# Patient Record
Sex: Male | Born: 1980 | Race: White | Hispanic: No | Marital: Married | Smoking: Former smoker
Health system: Southern US, Community
[De-identification: ages and names within clinical notes are randomized; demographics above are authoritative.]

---

## 2013-09-18 ENCOUNTER — Ambulatory Visit: Payer: Self-pay | Admitting: Family Medicine

## 2013-10-09 ENCOUNTER — Encounter: Payer: Self-pay | Admitting: Internal Medicine

## 2013-10-09 ENCOUNTER — Ambulatory Visit (INDEPENDENT_AMBULATORY_CARE_PROVIDER_SITE_OTHER): Payer: 59 | Admitting: Internal Medicine

## 2013-10-09 ENCOUNTER — Other Ambulatory Visit (INDEPENDENT_AMBULATORY_CARE_PROVIDER_SITE_OTHER): Payer: 59

## 2013-10-09 VITALS — BP 110/86 | HR 72 | Temp 98.6°F | Resp 16 | Ht 71.0 in | Wt 263.0 lb

## 2013-10-09 DIAGNOSIS — D485 Neoplasm of uncertain behavior of skin: Secondary | ICD-10-CM | POA: Insufficient documentation

## 2013-10-09 DIAGNOSIS — Z0000 Encounter for general adult medical examination without abnormal findings: Secondary | ICD-10-CM

## 2013-10-09 DIAGNOSIS — M4200 Juvenile osteochondrosis of spine, site unspecified: Secondary | ICD-10-CM

## 2013-10-09 LAB — BASIC METABOLIC PANEL
BUN: 11 mg/dL (ref 6–23)
CALCIUM: 9.5 mg/dL (ref 8.4–10.5)
CO2: 25 mEq/L (ref 19–32)
CREATININE: 0.9 mg/dL (ref 0.4–1.5)
Chloride: 106 mEq/L (ref 96–112)
GFR: 100.91 mL/min (ref >60.00)
Glucose, Bld: 83 mg/dL (ref 70–99)
Potassium: 4.1 mEq/L (ref 3.5–5.1)
Sodium: 141 mEq/L (ref 135–145)

## 2013-10-09 LAB — URINALYSIS
Bilirubin Urine: NEGATIVE
Hgb urine dipstick: NEGATIVE
KETONES UR: NEGATIVE
Leukocytes, UA: NEGATIVE
Nitrite: NEGATIVE
PH: 8 (ref 5.0–8.0)
SPECIFIC GRAVITY, URINE: 1.01 (ref 1.000–1.030)
Total Protein, Urine: NEGATIVE
URINE GLUCOSE: NEGATIVE
Urobilinogen, UA: 0.2 (ref 0.0–1.0)

## 2013-10-09 LAB — CBC WITH DIFFERENTIAL/PLATELET
BASOS ABS: 0.1 10*3/uL (ref 0.0–0.1)
Basophils Relative: 1.3 % (ref 0.0–3.0)
EOS ABS: 0.2 10*3/uL (ref 0.0–0.7)
Eosinophils Relative: 2.1 % (ref 0.0–5.0)
HCT: 47.5 % (ref 39.0–52.0)
HEMOGLOBIN: 16.1 g/dL (ref 13.0–17.0)
LYMPHS PCT: 25.7 % (ref 12.0–46.0)
Lymphs Abs: 2.6 10*3/uL (ref 0.7–4.0)
MCHC: 33.8 g/dL (ref 30.0–36.0)
MCV: 85.5 fl (ref 78.0–100.0)
MONOS PCT: 7.1 % (ref 3.0–12.0)
Monocytes Absolute: 0.7 10*3/uL (ref 0.1–1.0)
NEUTROS ABS: 6.4 10*3/uL (ref 1.4–7.7)
NEUTROS PCT: 63.8 % (ref 43.0–77.0)
Platelets: 275 10*3/uL (ref 150.0–400.0)
RBC: 5.56 Mil/uL (ref 4.22–5.81)
RDW: 12.8 % (ref 11.5–15.5)
WBC: 10 10*3/uL (ref 4.0–10.5)

## 2013-10-09 LAB — LIPID PANEL
CHOLESTEROL: 220 mg/dL — AB (ref 0–200)
HDL: 41.7 mg/dL (ref >39.00)
LDL Cholesterol: 147 mg/dL — ABNORMAL HIGH (ref 0–99)
NonHDL: 178.3
Total CHOL/HDL Ratio: 5
Triglycerides: 157 mg/dL — ABNORMAL HIGH (ref 0.0–149.0)
VLDL: 31.4 mg/dL (ref 0.0–40.0)

## 2013-10-09 LAB — HEPATIC FUNCTION PANEL
ALBUMIN: 4.6 g/dL (ref 3.5–5.2)
ALK PHOS: 55 U/L (ref 39–117)
ALT: 41 U/L (ref 0–53)
AST: 27 U/L (ref 0–37)
BILIRUBIN DIRECT: 0.1 mg/dL (ref 0.0–0.3)
Total Bilirubin: 0.8 mg/dL (ref 0.2–1.2)
Total Protein: 8 g/dL (ref 6.0–8.3)

## 2013-10-09 LAB — TSH: TSH: 2.87 u[IU]/mL (ref 0.35–4.50)

## 2013-10-09 MED ORDER — VITAMIN D 1000 UNITS PO TABS: 1000.0000 [IU] | ORAL_TABLET | Freq: Every day | ORAL | Status: AC

## 2013-10-09 NOTE — Progress Notes (Signed)
Pre visit review using our clinic review tool, if applicable. No additional management support is needed unless otherwise documented below in the visit note. 

## 2013-10-09 NOTE — Assessment & Plan Note (Signed)
We discussed age appropriate health related issues, including available/recomended screening tests and vaccinations. We discussed a need for adhering to healthy diet and exercise. Labs/EKG were reviewed/ordered. All questions were answered.   

## 2013-10-09 NOTE — Assessment & Plan Note (Signed)
Moles Had a nl bx 2-3 years ago 2012 Will watch

## 2013-10-09 NOTE — Assessment & Plan Note (Signed)
Kyphosis/scoliosis since high school years Dr Tamala Julian - will consult re: physical activity

## 2013-10-09 NOTE — Progress Notes (Signed)
   Subjective:    HPI  New pt - wellness exam  C/o scoliosis and kyphosis that was discovered in high school   Review of Systems  Constitutional: Negative for appetite change, fatigue and unexpected weight change.  HENT: Negative for congestion, dental problem, hearing loss, nosebleeds, sneezing, sore throat and trouble swallowing.   Eyes: Negative for itching and visual disturbance.  Respiratory: Negative for cough.   Cardiovascular: Negative for chest pain, palpitations and leg swelling.  Gastrointestinal: Negative for nausea, diarrhea, blood in stool and abdominal distention.  Genitourinary: Negative for urgency, frequency and hematuria.  Musculoskeletal: Negative for back pain, gait problem, joint swelling and neck pain.  Skin: Negative for rash.  Neurological: Negative for dizziness, tremors, speech difficulty and weakness.  Psychiatric/Behavioral: Negative for suicidal ideas, sleep disturbance, dysphoric mood and agitation. The patient is not nervous/anxious.        Objective:   Physical Exam  Constitutional: He is oriented to person, place, and time. He appears well-developed. No distress.  NAD  HENT:  Mouth/Throat: Oropharynx is clear and moist.  Eyes: Conjunctivae are normal. Pupils are equal, round, and reactive to light.  Neck: Normal range of motion. No JVD present. No thyromegaly present.  Cardiovascular: Normal rate, regular rhythm, normal heart sounds and intact distal pulses.  Exam reveals no gallop and no friction rub.   No murmur heard. Pulmonary/Chest: Effort normal and breath sounds normal. No respiratory distress. He has no wheezes. He has no rales. He exhibits no tenderness.  Abdominal: Soft. Bowel sounds are normal. He exhibits no distension and no mass. There is no tenderness. There is no rebound and no guarding.  Musculoskeletal: Normal range of motion. He exhibits no edema and no tenderness.  Big chest, prominent kyphosis, mild R thoracic spine  scoliosis  Lymphadenopathy:    He has no cervical adenopathy.  Neurological: He is alert and oriented to person, place, and time. He has normal reflexes. No cranial nerve deficit. He exhibits normal muscle tone. He displays a negative Romberg sign. Coordination and gait normal.  Skin: Skin is warm and dry. No rash noted.  Psychiatric: He has a normal mood and affect. His behavior is normal. Judgment and thought content normal.   Moles on back    Assessment & Plan:

## 2013-10-27 ENCOUNTER — Ambulatory Visit (INDEPENDENT_AMBULATORY_CARE_PROVIDER_SITE_OTHER)
Admission: RE | Admit: 2013-10-27 | Discharge: 2013-10-27 | Disposition: A | Discharge status: Home / Self Care | Payer: 59 | Source: Ambulatory Visit | Attending: Family Medicine | Admitting: Family Medicine

## 2013-10-27 ENCOUNTER — Ambulatory Visit (INDEPENDENT_AMBULATORY_CARE_PROVIDER_SITE_OTHER): Payer: 59 | Admitting: Family Medicine

## 2013-10-27 ENCOUNTER — Other Ambulatory Visit: Payer: Self-pay | Admitting: Family Medicine

## 2013-10-27 ENCOUNTER — Encounter: Payer: Self-pay | Admitting: Family Medicine

## 2013-10-27 VITALS — BP 124/80 | HR 66 | Ht 71.0 in | Wt 261.0 lb

## 2013-10-27 DIAGNOSIS — M4120 Other idiopathic scoliosis, site unspecified: Secondary | ICD-10-CM

## 2013-10-27 DIAGNOSIS — M419 Scoliosis, unspecified: Secondary | ICD-10-CM

## 2013-10-27 NOTE — Patient Instructions (Signed)
You are doing great! We will get new xrays today.  Vitamin D 2000 IU daily.  Exercises you are doing are great.   Continue to focus on back, upper body but need lower body but only with body weight.  .Posture on wall.  Stand on wall with heels, butt shoulders and head touching for a goal of 5 minutes daily.  Avoid any weighted squats. Swimming only 1 time a week.  Yoga could be great.  Focus on core strength.  ListRules.co.uk.php.  Look at website.  Or livestrong.com look up scoliosis.  Try some of those stretches 3 times a week.  Important to watch weight  Also good shoes at all times.  Thicker rigid sole shoes.  If you need help with the weight loss or exercises then come back in 4 weeks.

## 2013-10-27 NOTE — Progress Notes (Signed)
  [name] [address], [place] [address] Phone: [phone] Subjective:    I'm seeing this patient by the request  of:  [name], MD   CC: Scoliosis  DJS:[id number] Ricky [name] is a 33 y.o. male coming in with complaint of scoliosis. Patient does have some mild back pain. Patient states he was diagnosed when he was an adolescent. Patient has been working on back strengthening exercises for most of his life to try to avoid knee pain. Patient states he'll he has some mild discomfort if he stands for long amount of time. Does not take any pain medications. Attempts to the gym 2-3 times a week. Patient is focus on the back mostly with his weightlifting. Not to allow legs or cardiovascular fitness. Denies any radiation down the legs or any numbness. Patient would like some further discussion about what would be safe and what to avoid.     Past medical history, social, surgical and family history all reviewed in electronic medical record.   Review of Systems: No headache, visual changes, nausea, vomiting, diarrhea, constipation, dizziness, abdominal pain, skin rash, fevers, chills, night sweats, weight loss, swollen lymph nodes, body aches, joint swelling, muscle aches, chest pain, shortness of breath, mood changes.   Objective Blood pressure 124/80, pulse 66, height 5\' 11"  (1.803 m), weight 261 lb (118.389 kg), SpO2 99.00%.  General: No apparent distress alert and oriented x3 mood and affect normal, dressed appropriately.  HEENT: Pupils equal, extraocular movements intact  Respiratory: Patient's speak in full sentences and does not appear short of breath  Cardiovascular: No lower extremity edema, non tender, no erythema  Skin: Warm dry intact with no signs of infection or rash on extremities or on axial skeleton.  Abdomen: Soft nontender  Neuro: Cranial nerves II through XII are intact, neurovascularly intact in all extremities with 2+ DTRs and  2+ pulses.  Lymph: No lymphadenopathy of posterior or anterior cervical chain or axillae bilaterally.  Gait normal with good balance and coordination.  MSK:  Non tender with full range of motion and good stability and symmetric strength and tone of shoulders, elbows, wrist, hip, knee and ankles bilaterally.  Back Exam:  Inspection: Dextroscoliosis of the thoracic spine poor course strength Motion: Flexion 35 deg, Extension 30 deg, Side Bending to 35 deg bilaterally,  Rotation to 35 deg bilaterally  SLR laying: Negative  XSLR laying: Negative  Palpable tenderness: Mild in the thoracic lumbar region no spinous process tenderness.. FABER: negative. Sensory change: Gross sensation intact to all lumbar and sacral dermatomes.  Reflexes: 2+ at both patellar tendons, 2+ at achilles tendons, Babinski's downgoing.  Strength at foot  Plantar-flexion: 5/5 Dorsi-flexion: 5/5 Eversion: 5/5 Inversion: 5/5  Leg strength  Quad: 5/5 Hamstring: 5/5 Hip flexor: 5/5 Hip abductors: 5/5  Gait unremarkable.     Impression and Recommendations:     This case required medical decision making of moderate complexity.

## 2013-10-27 NOTE — Assessment & Plan Note (Signed)
Patient does have what appears to be about a 30 curve and only a primary curve in the thoracic spine with dextro scoliosis. Patient is remarkably Asymptomatic though at baseline. Encourage patient though to be significantly more active try to increase the amount of exercises he does per week. We discussed avoiding repetitive extension of the back at this time but is working on work postural exercises as well as working positioning. We also discussed about the importance of core strengthening which will alleviate a lot of pain on the back. We discussed over-the-counter medications it could also be beneficial to slow down the progression of osteoarthritis. Patient will try these interventions as well as weight loss and come back and see me again in 4-6 weeks for further evaluation and treatment. X-rays were ordered today as well for baseline.

## 2014-02-01 ENCOUNTER — Ambulatory Visit (INDEPENDENT_AMBULATORY_CARE_PROVIDER_SITE_OTHER): Payer: Managed Care, Other (non HMO)

## 2014-02-01 DIAGNOSIS — Z23 Encounter for immunization: Secondary | ICD-10-CM

## 2016-03-31 ENCOUNTER — Ambulatory Visit (INDEPENDENT_AMBULATORY_CARE_PROVIDER_SITE_OTHER): Payer: BLUE CROSS/BLUE SHIELD | Admitting: Nurse Practitioner

## 2016-03-31 ENCOUNTER — Encounter: Payer: Self-pay | Admitting: Nurse Practitioner

## 2016-03-31 VITALS — BP 116/84 | HR 66 | Temp 98.0°F | Ht 71.0 in | Wt 271.0 lb

## 2016-03-31 DIAGNOSIS — J069 Acute upper respiratory infection, unspecified: Secondary | ICD-10-CM

## 2016-03-31 MED ORDER — FLUTICASONE PROPIONATE 50 MCG/ACT NA SUSP: 2.0000 | Freq: Every day | NASAL | 0 refills | Status: DC

## 2016-03-31 MED ORDER — HYDROCODONE-HOMATROPINE 5-1.5 MG/5ML PO SYRP: 10.0000 mL | ORAL_SOLUTION | Freq: Every evening | ORAL | 0 refills | Status: DC | PRN

## 2016-03-31 MED ORDER — OXYMETAZOLINE HCL 0.05 % NA SOLN: 1.0000 | Freq: Two times a day (BID) | NASAL | 0 refills | Status: DC

## 2016-03-31 MED ORDER — DM-GUAIFENESIN ER 30-600 MG PO TB12: 1.0000 | ORAL_TABLET | Freq: Two times a day (BID) | ORAL | 0 refills | Status: DC | PRN

## 2016-03-31 MED ORDER — SALINE SPRAY 0.65 % NA SOLN: 1.0000 | NASAL | 0 refills | Status: DC | PRN

## 2016-03-31 NOTE — Progress Notes (Signed)
Subjective:  Patient ID: Ricky [name], male    DOB: Sep 21, 1980  Age: 35 y.o. MRN: [id number]  CC: Cough (coughing yellow mucus,slight sore throat,weak for 5 days. took OTC (nyquil))   Cough  This is a new problem. The current episode started in the past 7 days. The problem has been unchanged. The problem occurs constantly. The cough is productive of sputum. Associated symptoms include nasal congestion, postnasal drip, rhinorrhea and a sore throat. Pertinent negatives include no chest pain, chills, ear congestion, ear pain, fever, headaches, heartburn, hemoptysis, myalgias, rash, shortness of breath, sweats, weight loss or wheezing. The symptoms are aggravated by cold air and lying down. He has tried OTC cough suppressant for the symptoms.    No outpatient prescriptions prior to visit.   No facility-administered medications prior to visit.     ROS See HPI  Objective:  BP 116/84   Pulse 66   Temp 98 F (36.7 C)   Ht 5\' 11"  (1.803 m)   Wt 271 lb (122.9 kg)   SpO2 97%   BMI 37.80 kg/m   BP Readings from Last 3 Encounters:  03/31/16 116/84  10/27/13 124/80  10/09/13 110/86    Wt Readings from Last 3 Encounters:  03/31/16 271 lb (122.9 kg)  10/27/13 261 lb (118.4 kg)  10/09/13 263 lb (119.3 kg)    Physical Exam  Constitutional: He is oriented to person, place, and time. No distress.  HENT:  Right Ear: Tympanic membrane, external ear and ear canal normal.  Left Ear: Tympanic membrane and ear canal normal.  Nose: Mucosal edema and rhinorrhea present. Right sinus exhibits no maxillary sinus tenderness and no frontal sinus tenderness. Left sinus exhibits no maxillary sinus tenderness and no frontal sinus tenderness.  Mouth/Throat: Uvula is midline. Posterior oropharyngeal erythema present. No oropharyngeal exudate.  Eyes: No scleral icterus.  Neck: Normal range of motion. Neck supple.  Cardiovascular: Normal rate and regular rhythm.   Pulmonary/Chest: Effort normal and  breath sounds normal.  Lymphadenopathy:    He has no cervical adenopathy.  Neurological: He is alert and oriented to person, place, and time.  Vitals reviewed.   Lab Results  Component Value Date   WBC 10.0 10/09/2013   HGB 16.1 10/09/2013   HCT 47.5 10/09/2013   PLT 275.0 10/09/2013   GLUCOSE 83 10/09/2013   CHOL 220 (H) 10/09/2013   TRIG 157.0 (H) 10/09/2013   HDL 41.70 10/09/2013   LDLCALC 147 (H) 10/09/2013   ALT 41 10/09/2013   AST 27 10/09/2013   NA 141 10/09/2013   K 4.1 10/09/2013   CL 106 10/09/2013   CREATININE 0.9 10/09/2013   BUN 11 10/09/2013   CO2 25 10/09/2013   TSH 2.87 10/09/2013    Dg Thoracic Spine W/swimmers  Result Date: 10/27/2013 CLINICAL DATA:  Back pain EXAM: THORACIC SPINE - 2 VIEW + SWIMMERS COMPARISON:  None. FINDINGS: Frontal, lateral, and swimmer's views were obtained. There is anterior wedging of the the T10, T11, T12, L1, and L2 vertebral bodies with the greatest degree of wedging at T11 and T12. There is increased kyphosis in the lower thoracic region. No spondylolisthesis. There is mild disc space narrowing at multiple levels. IMPRESSION: Multiple lower thoracic and upper lumbar compression fractures, age uncertain. Increase kyphosis in the lower thoracic region, apparently due to these areas of compression. No spondylolisthesis. 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 Electronically Signed   By: Lowella Grip M.D.   On: 10/27/2013 09:10  Dg Lumbar Spine Complete  Result Date: 10/27/2013 CLINICAL DATA:  Scoliosis, back pain EXAM: LUMBAR SPINE - COMPLETE 4+ VIEW COMPARISON:  None. FINDINGS: Five views of lumbar spine submitted. No acute fracture or subluxation. Mild anterior spurring upper and lower endplate of L1 vertebral body. There is mild disc space flattening with anterior spurring at L1-L2 level. Mild disc space flattening at T12-L1 level. Mild disc space flattening at L5-S1 level. There is  mild compression deformity upper endplate of 624THL vertebral body. Moderate compression deformity T10 and T11 vertebral bodies. These are of indeterminate age. Clinical correlation is necessary. IMPRESSION: Degenerative changes as described above. There is mild compression deformity upper endplate of 624THL vertebral body. Moderate compression deformity T10 and T11 vertebral bodies. These are of indeterminate age. Clinical correlation is necessary. Electronically Signed   By: Lahoma Crocker M.D.   On: 10/27/2013 09:25    Assessment & Plan:   Ricky [name] was seen today for cough.  Diagnoses and all orders for this visit:  Acute URI -     fluticasone (FLONASE) 50 MCG/ACT nasal spray; Place 2 sprays into both nostrils daily. -     oxymetazoline (AFRIN NASAL SPRAY) 0.05 % nasal spray; Place 1 spray into both nostrils 2 (two) times daily. Use only for 3days, then stop -     dextromethorphan-guaiFENesin (MUCINEX DM) 30-600 MG 12hr tablet; Take 1 tablet by mouth 2 (two) times daily as needed for cough. -     sodium chloride (OCEAN) 0.65 % SOLN nasal spray; Place 1 spray into both nostrils as needed for congestion. -     HYDROcodone-homatropine (HYCODAN) 5-1.5 MG/5ML syrup; Take 10 mLs by mouth at bedtime as needed for cough.   I am having Ricky [name] start on fluticasone, oxymetazoline, dextromethorphan-guaiFENesin, sodium chloride, and HYDROcodone-homatropine.  Meds ordered this encounter  Medications  . fluticasone (FLONASE) 50 MCG/ACT nasal spray    Sig: Place 2 sprays into both nostrils daily.    Dispense:  16 g    Refill:  0    Order Specific Question:   Supervising Provider    Answer:   Cassandria Anger [[id number]]  . oxymetazoline (AFRIN NASAL SPRAY) 0.05 % nasal spray    Sig: Place 1 spray into both nostrils 2 (two) times daily. Use only for 3days, then stop    Dispense:  30 mL    Refill:  0    Order Specific Question:   Supervising Provider    Answer:   Cassandria Anger [[id number]]  .  dextromethorphan-guaiFENesin (MUCINEX DM) 30-600 MG 12hr tablet    Sig: Take 1 tablet by mouth 2 (two) times daily as needed for cough.    Dispense:  14 tablet    Refill:  0    Order Specific Question:   Supervising Provider    Answer:   Cassandria Anger [[id number]]  . sodium chloride (OCEAN) 0.65 % SOLN nasal spray    Sig: Place 1 spray into both nostrils as needed for congestion.    Dispense:  15 mL    Refill:  0    Order Specific Question:   Supervising Provider    Answer:   Cassandria Anger [[id number]]  . HYDROcodone-homatropine (HYCODAN) 5-1.5 MG/5ML syrup    Sig: Take 10 mLs by mouth at bedtime as needed for cough.    Dispense:  120 mL    Refill:  0    Order Specific Question:   Supervising Provider    Answer:   Cassandria Anger [  [id number]]    Follow-up: No Follow-up on file.  Wilfred Lacy, NP

## 2016-03-31 NOTE — Progress Notes (Signed)
Pre visit review using our clinic review tool, if applicable. No additional management support is needed unless otherwise documented below in the visit note. 

## 2016-03-31 NOTE — Patient Instructions (Signed)
URI Instructions: Flonase and Afrin use: apply 1spray of afrin in each nare, wait 38mins, then apply 2sprays of flonase in each nare. Use both nasal spray consecutively x 3days, then flonase only for at least 14days.  Encourage adequate oral hydration.  Use over-the-counter  "cold" medicines  such as "Tylenol cold" , "Advil cold",  "Mucinex" or" Mucinex D"  for cough and congestion.  Avoid decongestants if you have high blood pressure. Use" Delsym" or" Robitussin" cough syrup varietis for cough.  You can use plain "Tylenol" or "Advi"l for fever, chills and achyness.   "Common cold" symptoms are usually triggered by a virus.  The antibiotics are usually not necessary. On average, a" viral cold" illness would take 4-7 days to resolve. Please, make an appointment if you are not better or if you're worse.   Call office for oral abx prescription if no improvement by Friday.

## 2016-05-05 ENCOUNTER — Ambulatory Visit (INDEPENDENT_AMBULATORY_CARE_PROVIDER_SITE_OTHER): Payer: BLUE CROSS/BLUE SHIELD | Admitting: General Practice

## 2016-05-05 ENCOUNTER — Ambulatory Visit: Payer: BLUE CROSS/BLUE SHIELD

## 2016-05-05 DIAGNOSIS — Z23 Encounter for immunization: Secondary | ICD-10-CM | POA: Diagnosis not present

## 2016-09-03 ENCOUNTER — Ambulatory Visit (INDEPENDENT_AMBULATORY_CARE_PROVIDER_SITE_OTHER): Payer: BLUE CROSS/BLUE SHIELD | Admitting: Internal Medicine

## 2016-09-03 ENCOUNTER — Other Ambulatory Visit (INDEPENDENT_AMBULATORY_CARE_PROVIDER_SITE_OTHER): Payer: BLUE CROSS/BLUE SHIELD

## 2016-09-03 ENCOUNTER — Encounter: Payer: Self-pay | Admitting: Internal Medicine

## 2016-09-03 DIAGNOSIS — H18609 Keratoconus, unspecified, unspecified eye: Secondary | ICD-10-CM | POA: Insufficient documentation

## 2016-09-03 DIAGNOSIS — Z0000 Encounter for general adult medical examination without abnormal findings: Secondary | ICD-10-CM | POA: Diagnosis not present

## 2016-09-03 DIAGNOSIS — H18603 Keratoconus, unspecified, bilateral: Secondary | ICD-10-CM

## 2016-09-03 LAB — BASIC METABOLIC PANEL
BUN: 10 mg/dL (ref 6–23)
CO2: 25 mEq/L (ref 19–32)
CREATININE: 1.04 mg/dL (ref 0.40–1.50)
Calcium: 9.6 mg/dL (ref 8.4–10.5)
Chloride: 106 mEq/L (ref 96–112)
GFR: 86.09 mL/min (ref >60.00)
Glucose, Bld: 92 mg/dL (ref 70–99)
Potassium: 4.2 mEq/L (ref 3.5–5.1)
Sodium: 140 mEq/L (ref 135–145)

## 2016-09-03 LAB — URINALYSIS
Bilirubin Urine: NEGATIVE
HGB URINE DIPSTICK: NEGATIVE
Ketones, ur: 40 — AB
Leukocytes, UA: NEGATIVE
Nitrite: NEGATIVE
SPECIFIC GRAVITY, URINE: 1.025 (ref 1.000–1.030)
Total Protein, Urine: NEGATIVE
URINE GLUCOSE: NEGATIVE
Urobilinogen, UA: 0.2 (ref 0.0–1.0)
pH: 6 (ref 5.0–8.0)

## 2016-09-03 LAB — CBC WITH DIFFERENTIAL/PLATELET
BASOS ABS: 0.1 10*3/uL (ref 0.0–0.1)
Basophils Relative: 1.1 % (ref 0.0–3.0)
Eosinophils Absolute: 0.1 10*3/uL (ref 0.0–0.7)
Eosinophils Relative: 1.8 % (ref 0.0–5.0)
HCT: 44 % (ref 39.0–52.0)
HEMOGLOBIN: 14.9 g/dL (ref 13.0–17.0)
Lymphocytes Relative: 42 % (ref 12.0–46.0)
Lymphs Abs: 3.4 10*3/uL (ref 0.7–4.0)
MCHC: 33.9 g/dL (ref 30.0–36.0)
MCV: 84.3 fl (ref 78.0–100.0)
Monocytes Absolute: 0.6 10*3/uL (ref 0.1–1.0)
Monocytes Relative: 7.7 % (ref 3.0–12.0)
Neutro Abs: 3.8 10*3/uL (ref 1.4–7.7)
Neutrophils Relative %: 47.4 % (ref 43.0–77.0)
Platelets: 274 10*3/uL (ref 150.0–400.0)
RBC: 5.22 Mil/uL (ref 4.22–5.81)
RDW: 13.2 % (ref 11.5–15.5)
WBC: 8.1 10*3/uL (ref 4.0–10.5)

## 2016-09-03 LAB — LIPID PANEL
CHOL/HDL RATIO: 5
Cholesterol: 174 mg/dL (ref 0–200)
HDL: 37.1 mg/dL — ABNORMAL LOW (ref >39.00)
LDL CALC: 117 mg/dL — AB (ref 0–99)
NonHDL: 136.51
TRIGLYCERIDES: 99 mg/dL (ref 0.0–149.0)
VLDL: 19.8 mg/dL (ref 0.0–40.0)

## 2016-09-03 LAB — HEPATIC FUNCTION PANEL
ALT: 34 U/L (ref 0–53)
AST: 22 U/L (ref 0–37)
Albumin: 4.6 g/dL (ref 3.5–5.2)
Alkaline Phosphatase: 45 U/L (ref 39–117)
BILIRUBIN DIRECT: 0.1 mg/dL (ref 0.0–0.3)
BILIRUBIN TOTAL: 0.6 mg/dL (ref 0.2–1.2)
Total Protein: 7.6 g/dL (ref 6.0–8.3)

## 2016-09-03 LAB — TSH: TSH: 2.07 u[IU]/mL (ref 0.35–4.50)

## 2016-09-03 NOTE — Assessment & Plan Note (Signed)
Per ophth ref

## 2016-09-03 NOTE — Assessment & Plan Note (Addendum)
We discussed age appropriate health related issues, including available/recomended screening tests and vaccinations. We discussed a need for adhering to healthy diet and exercise. Labs were ordered to be later reviewed . All questions were answered. Eye exam

## 2016-09-03 NOTE — Progress Notes (Signed)
Subjective:  Patient ID: Ricky [name], male    DOB: 08-Sep-1980  Age: 36 y.o. MRN: [id number]  CC: No chief complaint on file.   HPI Ricky [name] presents for a well exam. Lost wt on diet  Outpatient Medications Prior to Visit  Medication Sig Dispense Refill  . dextromethorphan-guaiFENesin (MUCINEX DM) 30-600 MG 12hr tablet Take 1 tablet by mouth 2 (two) times daily as needed for cough. 14 tablet 0  . fluticasone (FLONASE) 50 MCG/ACT nasal spray Place 2 sprays into both nostrils daily. 16 g 0  . HYDROcodone-homatropine (HYCODAN) 5-1.5 MG/5ML syrup Take 10 mLs by mouth at bedtime as needed for cough. 120 mL 0  . oxymetazoline (AFRIN NASAL SPRAY) 0.05 % nasal spray Place 1 spray into both nostrils 2 (two) times daily. Use only for 3days, then stop 30 mL 0  . sodium chloride (OCEAN) 0.65 % SOLN nasal spray Place 1 spray into both nostrils as needed for congestion. 15 mL 0   No facility-administered medications prior to visit.     ROS Review of Systems  Constitutional: Negative for appetite change, fatigue and unexpected weight change.  HENT: Negative for congestion, nosebleeds, sneezing, sore throat and trouble swallowing.   Eyes: Negative for itching and visual disturbance.  Respiratory: Negative for cough.   Cardiovascular: Negative for chest pain, palpitations and leg swelling.  Gastrointestinal: Negative for abdominal distention, blood in stool, diarrhea and nausea.  Genitourinary: Negative for frequency and hematuria.  Musculoskeletal: Negative for back pain, gait problem, joint swelling and neck pain.  Skin: Negative for rash.  Neurological: Negative for dizziness, tremors, speech difficulty and weakness.  Psychiatric/Behavioral: Negative for agitation, dysphoric mood and sleep disturbance. The patient is not nervous/anxious.     Objective:  BP 122/76 (BP Location: Left Arm, Patient Position: Sitting, Cuff Size: Large)   Pulse 70   Temp 98.5 F (36.9 C) (Oral)   Ht 5'  11" (1.803 m)   Wt 257 lb (116.6 kg)   SpO2 98%   BMI 35.84 kg/m   BP Readings from Last 3 Encounters:  09/03/16 122/76  03/31/16 116/84  10/27/13 124/80    Wt Readings from Last 3 Encounters:  09/03/16 257 lb (116.6 kg)  03/31/16 271 lb (122.9 kg)  10/27/13 261 lb (118.4 kg)    Physical Exam  Constitutional: He is oriented to person, place, and time. He appears well-developed. No distress.  NAD  HENT:  Mouth/Throat: Oropharynx is clear and moist.  Eyes: Conjunctivae are normal. Pupils are equal, round, and reactive to light.  Neck: Normal range of motion. No JVD present. No thyromegaly present.  Cardiovascular: Normal rate, regular rhythm, normal heart sounds and intact distal pulses.  Exam reveals no gallop and no friction rub.   No murmur heard. Pulmonary/Chest: Effort normal and breath sounds normal. No respiratory distress. He has no wheezes. He has no rales. He exhibits no tenderness.  Abdominal: Soft. Bowel sounds are normal. He exhibits no distension and no mass. There is no tenderness. There is no rebound and no guarding.  Musculoskeletal: Normal range of motion. He exhibits no edema or tenderness.  Lymphadenopathy:    He has no cervical adenopathy.  Neurological: He is alert and oriented to person, place, and time. He has normal reflexes. No cranial nerve deficit. He exhibits normal muscle tone. He displays a negative Romberg sign. Coordination and gait normal.  Skin: Skin is warm and dry. No rash noted.  Psychiatric: He has a normal mood and affect. His behavior is  normal. Judgment and thought content normal.    Lab Results  Component Value Date   WBC 10.0 10/09/2013   HGB 16.1 10/09/2013   HCT 47.5 10/09/2013   PLT 275.0 10/09/2013   GLUCOSE 83 10/09/2013   CHOL 220 (H) 10/09/2013   TRIG 157.0 (H) 10/09/2013   HDL 41.70 10/09/2013   LDLCALC 147 (H) 10/09/2013   ALT 41 10/09/2013   AST 27 10/09/2013   NA 141 10/09/2013   K 4.1 10/09/2013   CL 106  10/09/2013   CREATININE 0.9 10/09/2013   BUN 11 10/09/2013   CO2 25 10/09/2013   TSH 2.87 10/09/2013    Dg Thoracic Spine W/swimmers  Result Date: 10/27/2013 CLINICAL DATA:  Back pain EXAM: THORACIC SPINE - 2 VIEW + SWIMMERS COMPARISON:  None. FINDINGS: Frontal, lateral, and swimmer's views were obtained. There is anterior wedging of the the T10, T11, T12, L1, and L2 vertebral bodies with the greatest degree of wedging at T11 and T12. There is increased kyphosis in the lower thoracic region. No spondylolisthesis. There is mild disc space narrowing at multiple levels. IMPRESSION: Multiple lower thoracic and upper lumbar compression fractures, age uncertain. Increase kyphosis in the lower thoracic region, apparently due to these areas of compression. No spondylolisthesis. 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 Electronically Signed   By: Lowella Grip M.D.   On: 10/27/2013 09:10   Dg Lumbar Spine Complete  Result Date: 10/27/2013 CLINICAL DATA:  Scoliosis, back pain EXAM: LUMBAR SPINE - COMPLETE 4+ VIEW COMPARISON:  None. FINDINGS: Five views of lumbar spine submitted. No acute fracture or subluxation. Mild anterior spurring upper and lower endplate of L1 vertebral body. There is mild disc space flattening with anterior spurring at L1-L2 level. Mild disc space flattening at T12-L1 level. Mild disc space flattening at L5-S1 level. There is mild compression deformity upper endplate of P53 vertebral body. Moderate compression deformity T10 and T11 vertebral bodies. These are of indeterminate age. Clinical correlation is necessary. IMPRESSION: Degenerative changes as described above. There is mild compression deformity upper endplate of Z48 vertebral body. Moderate compression deformity T10 and T11 vertebral bodies. These are of indeterminate age. Clinical correlation is necessary. Electronically Signed   By: Lahoma Crocker M.D.   On: 10/27/2013 09:25     Assessment & Plan:   There are no diagnoses linked to this encounter. I have discontinued Mr. Handlin fluticasone, oxymetazoline, dextromethorphan-guaiFENesin, sodium chloride, and HYDROcodone-homatropine. I am also having him maintain his Omega-3 Fatty Acids (FISH OIL PO) and cholecalciferol.  Meds ordered this encounter  Medications  . Omega-3 Fatty Acids (FISH OIL PO)    Sig: Take by mouth.  . cholecalciferol (VITAMIN D) 1000 units tablet    Sig: Take 1,000 Units by mouth daily.     Follow-up: No Follow-up on file.  [name], MD

## 2017-12-30 ENCOUNTER — Encounter: Payer: Self-pay | Admitting: Internal Medicine

## 2017-12-30 ENCOUNTER — Ambulatory Visit (INDEPENDENT_AMBULATORY_CARE_PROVIDER_SITE_OTHER): Payer: BLUE CROSS/BLUE SHIELD | Admitting: Internal Medicine

## 2017-12-30 DIAGNOSIS — H9201 Otalgia, right ear: Secondary | ICD-10-CM

## 2017-12-30 DIAGNOSIS — H9209 Otalgia, unspecified ear: Secondary | ICD-10-CM | POA: Insufficient documentation

## 2017-12-30 MED ORDER — NEOMYCIN-POLYMYXIN-HC 3.5-10000-1 OT SOLN: 3.0000 [drp] | Freq: Three times a day (TID) | OTIC | 3 refills | Status: AC

## 2017-12-30 MED ORDER — CEFDINIR 300 MG PO CAPS: 300.0000 mg | ORAL_CAPSULE | Freq: Two times a day (BID) | ORAL | 0 refills | Status: DC

## 2017-12-30 NOTE — Assessment & Plan Note (Signed)
10/19 R OE vs OM Cortisporin otic R Cefdinir if not better

## 2017-12-30 NOTE — Progress Notes (Signed)
Subjective:  Patient ID: Ricky [name], male    DOB: 1980/12/21  Age: 37 y.o. MRN: [id number]  CC: No chief complaint on file.   HPI Ricky [name] presents for R ear pain x 1 month  Outpatient Medications Prior to Visit  Medication Sig Dispense Refill  . cholecalciferol (VITAMIN D) 1000 units tablet Take 1,000 Units by mouth daily.    . Omega-3 Fatty Acids (FISH OIL PO) Take by mouth.     No facility-administered medications prior to visit.     ROS: Review of Systems  Constitutional: Negative for fatigue and fever.  HENT: Positive for ear pain. Negative for congestion, ear discharge, facial swelling and sinus pain.     Objective:  BP 122/84 (BP Location: Left Arm, Patient Position: Sitting, Cuff Size: Large)   Pulse 61   Temp 98.9 F (37.2 C) (Oral)   Ht 5\' 11"  (1.803 m)   Wt 260 lb (117.9 kg)   SpO2 96%   BMI 36.26 kg/m   BP Readings from Last 3 Encounters:  12/30/17 122/84  09/03/16 122/76  03/31/16 116/84    Wt Readings from Last 3 Encounters:  12/30/17 260 lb (117.9 kg)  09/03/16 257 lb (116.6 kg)  03/31/16 271 lb (122.9 kg)    Physical Exam  Constitutional: He appears well-developed. No distress.  HENT:  Head: Normocephalic and atraumatic.  Left Ear: External ear normal.  Nose: Nose normal.  Mouth/Throat: Oropharynx is clear and moist. No oropharyngeal exudate.  Eyes: Pupils are equal, round, and reactive to light.  R ear canal tender w/mild erythema; R TM w/mild erythema  Lab Results  Component Value Date   WBC 8.1 09/03/2016   HGB 14.9 09/03/2016   HCT 44.0 09/03/2016   PLT 274.0 09/03/2016   GLUCOSE 92 09/03/2016   CHOL 174 09/03/2016   TRIG 99.0 09/03/2016   HDL 37.10 (L) 09/03/2016   LDLCALC 117 (H) 09/03/2016   ALT 34 09/03/2016   AST 22 09/03/2016   NA 140 09/03/2016   K 4.2 09/03/2016   CL 106 09/03/2016   CREATININE 1.04 09/03/2016   BUN 10 09/03/2016   CO2 25 09/03/2016   TSH 2.07 09/03/2016    Dg Thoracic Spine  W/swimmers  Result Date: 10/27/2013 CLINICAL DATA:  Back pain EXAM: THORACIC SPINE - 2 VIEW + SWIMMERS COMPARISON:  None. FINDINGS: Frontal, lateral, and swimmer's views were obtained. There is anterior wedging of the the T10, T11, T12, L1, and L2 vertebral bodies with the greatest degree of wedging at T11 and T12. There is increased kyphosis in the lower thoracic region. No spondylolisthesis. There is mild disc space narrowing at multiple levels. IMPRESSION: Multiple lower thoracic and upper lumbar compression fractures, age uncertain. Increase kyphosis in the lower thoracic region, apparently due to these areas of compression. No spondylolisthesis. 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 Electronically Signed   By: Lowella Grip M.D.   On: 10/27/2013 09:10   Dg Lumbar Spine Complete  Result Date: 10/27/2013 CLINICAL DATA:  Scoliosis, back pain EXAM: LUMBAR SPINE - COMPLETE 4+ VIEW COMPARISON:  None. FINDINGS: Five views of lumbar spine submitted. No acute fracture or subluxation. Mild anterior spurring upper and lower endplate of L1 vertebral body. There is mild disc space flattening with anterior spurring at L1-L2 level. Mild disc space flattening at T12-L1 level. Mild disc space flattening at L5-S1 level. There is mild compression deformity upper endplate of N82 vertebral body. Moderate compression deformity T10 and T11  vertebral bodies. These are of indeterminate age. Clinical correlation is necessary. IMPRESSION: Degenerative changes as described above. There is mild compression deformity upper endplate of H06 vertebral body. Moderate compression deformity T10 and T11 vertebral bodies. These are of indeterminate age. Clinical correlation is necessary. Electronically Signed   By: Lahoma Crocker M.D.   On: 10/27/2013 09:25    Assessment & Plan:   There are no diagnoses linked to this encounter.   No orders of the defined types were placed in this  encounter.    Follow-up: No follow-ups on file.  [name], MD

## 2019-01-13 ENCOUNTER — Ambulatory Visit: Payer: BC Managed Care – PPO

## 2019-01-19 ENCOUNTER — Other Ambulatory Visit: Payer: Self-pay

## 2019-01-19 ENCOUNTER — Ambulatory Visit (INDEPENDENT_AMBULATORY_CARE_PROVIDER_SITE_OTHER): Payer: BC Managed Care – PPO

## 2019-01-19 DIAGNOSIS — Z23 Encounter for immunization: Secondary | ICD-10-CM

## 2019-05-20 ENCOUNTER — Ambulatory Visit: Payer: BC Managed Care – PPO

## 2019-09-20 ENCOUNTER — Other Ambulatory Visit: Payer: Self-pay

## 2019-09-20 ENCOUNTER — Ambulatory Visit: Payer: Self-pay

## 2019-09-20 ENCOUNTER — Encounter: Payer: Self-pay | Admitting: Family Medicine

## 2019-09-20 ENCOUNTER — Ambulatory Visit (INDEPENDENT_AMBULATORY_CARE_PROVIDER_SITE_OTHER): Payer: No Typology Code available for payment source | Admitting: Family Medicine

## 2019-09-20 VITALS — BP 120/78 | HR 79 | Ht 71.0 in | Wt 266.0 lb

## 2019-09-20 DIAGNOSIS — M7551 Bursitis of right shoulder: Secondary | ICD-10-CM | POA: Insufficient documentation

## 2019-09-20 DIAGNOSIS — M25511 Pain in right shoulder: Secondary | ICD-10-CM

## 2019-09-20 MED ORDER — MELOXICAM 15 MG PO TABS: 15.0000 mg | ORAL_TABLET | Freq: Every day | ORAL | 0 refills | Status: DC

## 2019-09-20 NOTE — Progress Notes (Signed)
[facility] [place] Phone: [phone] Subjective:   Ricky [name], am serving as a scribe for Dr. Hulan Saas. This visit occurred during the SARS-CoV-2 public health emergency.  Safety protocols were in place, including screening questions prior to the visit, additional usage of staff PPE, and extensive cleaning of exam room while observing appropriate contact time as indicated for disinfecting solutions.   I'm seeing this patient by the request  of:  Plotnikov, Evie Lacks, MD  CC: Right shoulder pain  [id number]  Ricky [name] is a 39 y.o. male coming in with complaint of right shoulder pain. Patient states that he has been having intermittent right shoulder pain for one year. Feels clicking when flexion over anterior aspect. Pain with sleeping.  Patient rates the severity of pain is 5 out of 10.  Has not tried taking anything significantly.  States that it is starting to notice more on a daily basis now at this time.       [name] past medical history on file. [name] past surgical history on file. Social History   Socioeconomic History  . Marital status: Married    Spouse name: Not on file  . Number of children: Not on file  . Years of education: Not on file  . Highest education level: Not on file  Occupational History  . Not on file  Tobacco Use  . Smoking status: Former Research scientist (life sciences)  . Smokeless tobacco: Never Used  Substance and Sexual Activity  . Alcohol use: Yes  . Drug use: [name]  . Sexual activity: Not on file  Other Topics Concern  . Not on file  Social History Narrative  . Not on file   Social Determinants of Health   Financial Resource Strain:   . Difficulty of Paying Living Expenses:   Food Insecurity:   . Worried About Charity fundraiser in the Last Year:   . Arboriculturist in the Last Year:   Transportation Needs:   . Film/video editor (Medical):   Marland Kitchen Lack of Transportation (Non-Medical):     Physical Activity:   . Days of Exercise per Week:   . Minutes of Exercise per Session:   Stress:   . Feeling of Stress :   Social Connections:   . Frequency of Communication with Friends and Family:   . Frequency of Social Gatherings with Friends and Family:   . Attends Religious Services:   . Active Member of Clubs or Organizations:   . Attends Archivist Meetings:   Marland Kitchen Marital Status:    [name] Known Allergies Family History  Problem Relation Age of Onset  . Breast cancer Other   . Hyperlipidemia Other   . Stroke Other   . Birth defects Other   . Heart disease Other   . Hypertension Other   . Diabetes Other   . Glaucoma Father          Current Outpatient Medications (Other):  .  cefdinir (OMNICEF) 300 MG capsule, Take 1 capsule (300 mg total) by mouth 2 (two) times daily. .  cholecalciferol (VITAMIN D) 1000 units tablet, Take 1,000 Units by mouth daily. .  Omega-3 Fatty Acids (FISH OIL PO), Take by mouth.   Reviewed prior external information including notes and imaging from  primary care provider As well as notes that were available from care everywhere and other healthcare systems.  Past medical history, social, surgical and family history all reviewed  in electronic medical record.  [name] pertanent information unless stated regarding to the chief complaint.   Review of Systems:  [name] headache, visual changes, nausea, vomiting, diarrhea, constipation, dizziness, abdominal pain, skin rash, fevers, chills, night sweats, weight loss, swollen lymph nodes, body aches, joint swelling, chest pain, shortness of breath, mood changes. POSITIVE muscle aches  Objective  There were [name] vitals taken for this visit.   General: [name] apparent distress alert and oriented x3 mood and affect normal, dressed appropriately.  HEENT: Pupils equal, extraocular movements intact  Respiratory: Patient's speak in full sentences and does not appear short of breath  Cardiovascular: [name] lower  extremity edema, non tender, [name] erythema  Neuro: Cranial nerves II through XII are intact, neurovascularly intact in all extremities with 2+ DTRs and 2+ pulses.  Gait normal with good balance and coordination.  MSK: Shoulder: Right Inspection reveals [name] abnormalities, atrophy or asymmetry. Palpation is normal with [name] tenderness over AC joint or bicipital groove. ROM is full in all planes passively. Rotator cuff strength normal throughout. signs of impingement with positive Neer and Hawkin's tests, but negative empty can sign. Speeds and Yergason's tests normal. [name] labral pathology noted with negative Obrien's, negative clunk and good stability. Normal scapular function observed. [name] painful arc and [name] drop arm sign. [name] apprehension sign  MSK US performed of: Right This study was ordered, performed, and interpreted by Charlann Boxer D.O.  Shoulder:   Supraspinatus:  Appears normal on long and transverse views, Bursal bulge seen with shoulder abduction on impingement view. Infraspinatus:  Appears normal on long and transverse views. Significant increase in Doppler flow Subscapularis:  Appears normal on long and transverse views. Positive bursa Teres Minor:  Appears normal on long and transverse views. AC joint:  Capsule undistended, [name] geyser sign. Glenohumeral Joint:  Appears normal without effusion. Glenoid Labrum:  Intact without visualized tears. Biceps Tendon:  Appears normal on long and transverse views, [name] fraying of tendon, tendon located in intertubercular groove, [name] subluxation with shoulder internal or external rotation.  Impression: Subacromial bursitis   [id number]; 15 additional minutes spent for Therapeutic exercises as stated in above notes.  This included exercises focusing on stretching, strengthening, with significant focus on eccentric aspects.   Long term goals include an improvement in range of motion, strength, endurance as well as avoiding reinjury. Patient's frequency  would include in 1-2 times a day, 3-5 times a week for a duration of 6-12 weeks. Shoulder Exercises that included:  Basic scapular stabilization to include adduction and depression of scapula Scaption, focusing on proper movement and good control Internal and External rotation utilizing a theraband, with elbow tucked at side entire time Rows with theraband    Proper technique shown and discussed handout in great detail with ATC.  All questions were discussed and answered.      Impression and Recommendations:     The above documentation has been reviewed and is accurate and complete Ricky Pulley, DO       Note: This dictation was prepared with Dragon dictation along with smaller phrase technology. Any transcriptional errors that result from this process are unintentional.

## 2019-09-20 NOTE — Assessment & Plan Note (Signed)
Patient is having increased edema shoulder bursitis.  Continue meloxicam, home exercise, encouraged continued rest, keep hands within peripheral vision, increase activity slowly.  Follow-up with me again in 4 to 6 weeks.

## 2019-09-20 NOTE — Patient Instructions (Signed)
Meloxicam Ice Keep hands in peripheral vision See me in 4-5 weeks

## 2019-10-19 ENCOUNTER — Other Ambulatory Visit: Payer: Self-pay

## 2019-10-19 ENCOUNTER — Ambulatory Visit (INDEPENDENT_AMBULATORY_CARE_PROVIDER_SITE_OTHER): Payer: No Typology Code available for payment source | Admitting: Family Medicine

## 2019-10-19 ENCOUNTER — Encounter: Payer: Self-pay | Admitting: Family Medicine

## 2019-10-19 DIAGNOSIS — M7551 Bursitis of right shoulder: Secondary | ICD-10-CM | POA: Diagnosis not present

## 2019-10-19 MED ORDER — MELOXICAM 15 MG PO TABS: 15.0000 mg | ORAL_TABLET | Freq: Every day | ORAL | 0 refills | Status: DC

## 2019-10-19 NOTE — Assessment & Plan Note (Signed)
Significant improvement overall.  Continue with conservative therapy at this time.  Discussed the possibility of the meloxicam refilled again.  Follow-up with me 3 months to make sure completely resolved

## 2019-10-19 NOTE — Patient Instructions (Addendum)
Good to see you Over all looks great Use cream at night Refilled meloxicam Keep hands in Periferal vision  See me again in 3 months

## 2019-10-19 NOTE — Progress Notes (Signed)
[name] [place] Phone: [phone] Subjective:   Ricky [name], am serving as a scribe for Dr. Hulan [name].  This visit occurred during the SARS-CoV-2 public health emergency.  Safety protocols were in place, including screening questions prior to the visit, additional usage of staff PPE, and extensive cleaning of exam room while observing appropriate contact time as indicated for disinfecting solutions.   I'm seeing this patient by the request  of:  Ricky [name], Ricky Lacks, MD  CC: Right shoulder pain follow-up  [id number]  Ricky [name] is a 39 y.o. male coming in with complaint of right shoulder pain.  Was seen previously and found to have more of a subacromial bursitis.  Patient given anti-inflammatories and home exercises.  Patient states feeling approximately 95% better.  Still states that when he makes certain movements or lays on it at night some stiffness in the morning but nothing that stops him from activity.  Taking the meloxicam fairly regularly.  Update 10/19/2019      History reviewed. No pertinent past medical history. History reviewed. No pertinent surgical history. Social History   Socioeconomic History  . Marital status: Married    Spouse name: Not on file  . Number of children: Not on file  . Years of education: Not on file  . Highest education level: Not on file  Occupational History  . Not on file  Tobacco Use  . Smoking status: Former Research scientist (life sciences)  . Smokeless tobacco: Never Used  Substance and Sexual Activity  . Alcohol use: Yes  . Drug use: No  . Sexual activity: Not on file  Other Topics Concern  . Not on file  Social History Narrative  . Not on file   Social Determinants of Health   Financial Resource Strain:   . Difficulty of Paying Living Expenses:   Food Insecurity:   . Worried About Charity fundraiser in the Last Year:   . Arboriculturist in the Last Year:     Transportation Needs:   . Film/video editor (Medical):   Marland Kitchen Lack of Transportation (Non-Medical):   Physical Activity:   . Days of Exercise per Week:   . Minutes of Exercise per Session:   Stress:   . Feeling of Stress :   Social Connections:   . Frequency of Communication with Friends and Family:   . Frequency of Social Gatherings with Friends and Family:   . Attends Religious Services:   . Active Member of Clubs or Organizations:   . Attends Archivist Meetings:   Marland Kitchen Marital Status:    No Known Allergies Family History  Problem Relation Age of Onset  . Breast cancer Other   . Hyperlipidemia Other   . Stroke Other   . Birth defects Other   . Heart disease Other   . Hypertension Other   . Diabetes Other   . Glaucoma Father        Current Outpatient Medications (Analgesics):  .  meloxicam (MOBIC) 15 MG tablet, Take 1 tablet (15 mg total) by mouth daily.   Current Outpatient Medications (Other):  .  cholecalciferol (VITAMIN D) 1000 units tablet, Take 1,000 Units by mouth daily. .  Omega-3 Fatty Acids (FISH OIL PO), Take by mouth.   Reviewed prior external information including notes and imaging from  primary care provider As well as notes that were available from care everywhere and other healthcare systems.  Past medical history,  social, surgical and family history all reviewed in electronic medical record.  No pertanent information unless stated regarding to the chief complaint.   Review of Systems:  No headache, visual changes, nausea, vomiting, diarrhea, constipation, dizziness, abdominal pain, skin rash, fevers, chills, night sweats, weight loss, swollen lymph nodes, body aches, joint swelling, chest pain, shortness of breath, mood changes. POSITIVE muscle aches  Objective  Blood pressure 110/80, pulse 77, height 5\' 11"  (1.803 m), weight 262 lb (118.8 kg), SpO2 98 %.   General: No apparent distress alert and oriented x3 mood and affect normal,  dressed appropriately.  HEENT: Pupils equal, extraocular movements intact  Respiratory: Patient's speak in full sentences and does not appear short of breath  Cardiovascular: No lower extremity edema, non tender, no erythema  Neuro: Cranial nerves II through XII are intact, neurovascularly intact in all extremities with 2+ DTRs and 2+ pulses.  Gait normal with good balance and coordination.  MSK:  Non tender with full range of motion and good stability and symmetric strength and tone of  elbows, wrist, hip, knee and ankles bilaterally.    Right shoulder exam shows the patient does have some tenderness to palpation patient states 90% better overall.  Mild positive crossover noted on both 5 out of 5 strength of the rotator cuff. Impression and Recommendations:     The above documentation has been reviewed and is accurate and complete Lyndal Pulley, DO       Note: This dictation was prepared with Dragon dictation along with smaller phrase technology. Any transcriptional errors that result from this process are unintentional.

## 2020-01-25 ENCOUNTER — Ambulatory Visit: Payer: No Typology Code available for payment source | Admitting: Family Medicine

## 2020-01-31 ENCOUNTER — Ambulatory Visit (INDEPENDENT_AMBULATORY_CARE_PROVIDER_SITE_OTHER): Payer: 59

## 2020-01-31 ENCOUNTER — Other Ambulatory Visit: Payer: Self-pay

## 2020-01-31 DIAGNOSIS — Z23 Encounter for immunization: Secondary | ICD-10-CM

## 2020-04-22 DIAGNOSIS — Z03818 Encounter for observation for suspected exposure to other biological agents ruled out: Secondary | ICD-10-CM | POA: Diagnosis not present

## 2020-06-11 NOTE — Progress Notes (Deleted)
  [place] Phone: [phone] Subjective:    I'm seeing this patient by the request  of:  Plotnikov, Evie Lacks, MD  CC:   [id number]  Ricky [name] is a 40 y.o. male coming in with complaint of right shoulder pain. Last seen in July of 2021 for bursitis of R shoulder. Patient states   Onset-  Location Duration-  Character- Aggravating factors- Reliving factors-  Therapies tried-  Severity-     No past medical history on file. No past surgical history on file. Social History   Socioeconomic History  . Marital status: Married    Spouse name: Not on file  . Number of children: Not on file  . Years of education: Not on file  . Highest education level: Not on file  Occupational History  . Not on file  Tobacco Use  . Smoking status: Former Research scientist (life sciences)  . Smokeless tobacco: Never Used  Substance and Sexual Activity  . Alcohol use: Yes  . Drug use: No  . Sexual activity: Not on file  Other Topics Concern  . Not on file  Social History Narrative  . Not on file   Social Determinants of Health   Financial Resource Strain: Not on file  Food Insecurity: Not on file  Transportation Needs: Not on file  Physical Activity: Not on file  Stress: Not on file  Social Connections: Not on file   No Known Allergies Family History  Problem Relation Age of Onset  . Breast cancer Other   . Hyperlipidemia Other   . Stroke Other   . Birth defects Other   . Heart disease Other   . Hypertension Other   . Diabetes Other   . Glaucoma Father        Current Outpatient Medications (Analgesics):  .  meloxicam (MOBIC) 15 MG tablet, Take 1 tablet (15 mg total) by mouth daily.   Current Outpatient Medications (Other):  .  cholecalciferol (VITAMIN D) 1000 units tablet, Take 1,000 Units by mouth daily. .  Omega-3 Fatty Acids (FISH OIL PO), Take by mouth.   Reviewed prior external information including notes  and imaging from  primary care provider As well as notes that were available from care everywhere and other healthcare systems.  Past medical history, social, surgical and family history all reviewed in electronic medical record.  No pertanent information unless stated regarding to the chief complaint.   Review of Systems:  No headache, visual changes, nausea, vomiting, diarrhea, constipation, dizziness, abdominal pain, skin rash, fevers, chills, night sweats, weight loss, swollen lymph nodes, body aches, joint swelling, chest pain, shortness of breath, mood changes. POSITIVE muscle aches  Objective  There were no vitals taken for this visit.   General: No apparent distress alert and oriented x3 mood and affect normal, dressed appropriately.  HEENT: Pupils equal, extraocular movements intact  Respiratory: Patient's speak in full sentences and does not appear short of breath  Cardiovascular: No lower extremity edema, non tender, no erythema  Gait normal with good balance and coordination.  MSK:  Non tender with full range of motion and good stability and symmetric strength and tone of shoulders, elbows, wrist, hip, knee and ankles bilaterally.     Impression and Recommendations:     The above documentation has been reviewed and is accurate and complete Jacqualin Combes

## 2020-06-12 ENCOUNTER — Ambulatory Visit: Payer: No Typology Code available for payment source | Admitting: Family Medicine

## 2020-08-05 ENCOUNTER — Telehealth: Payer: Self-pay | Admitting: Internal Medicine

## 2020-08-05 NOTE — Telephone Encounter (Signed)
Patient calling, states he is traveling to [place], specifically the [employer] and is wondering if there are any vaccines he should get before he goes.

## 2020-08-05 NOTE — Telephone Encounter (Signed)
Hep A and B vaccine COVID 19 vaccine  if not done Thx

## 2020-08-06 NOTE — Telephone Encounter (Signed)
Called pt gave MD response. Pt states he already had his covid injections. Made appt for Hep A/B for next week 5/18.Marland KitchenJohny Chess

## 2020-08-13 ENCOUNTER — Other Ambulatory Visit: Payer: Self-pay

## 2020-08-14 ENCOUNTER — Ambulatory Visit (INDEPENDENT_AMBULATORY_CARE_PROVIDER_SITE_OTHER): Payer: BC Managed Care – PPO

## 2020-08-14 DIAGNOSIS — Z23 Encounter for immunization: Secondary | ICD-10-CM

## 2020-10-16 ENCOUNTER — Ambulatory Visit (INDEPENDENT_AMBULATORY_CARE_PROVIDER_SITE_OTHER): Payer: BC Managed Care – PPO | Admitting: Internal Medicine

## 2020-10-16 ENCOUNTER — Other Ambulatory Visit: Payer: Self-pay

## 2020-10-16 ENCOUNTER — Encounter: Payer: Self-pay | Admitting: Internal Medicine

## 2020-10-16 VITALS — BP 110/78 | HR 52 | Temp 98.1°F | Ht 71.0 in | Wt 253.8 lb

## 2020-10-16 DIAGNOSIS — Z0000 Encounter for general adult medical examination without abnormal findings: Secondary | ICD-10-CM | POA: Diagnosis not present

## 2020-10-16 LAB — CBC WITH DIFFERENTIAL/PLATELET
Basophils Absolute: 0 10*3/uL (ref 0.0–0.1)
Basophils Relative: 0.5 % (ref 0.0–3.0)
Eosinophils Absolute: 0.1 10*3/uL (ref 0.0–0.7)
Eosinophils Relative: 1.8 % (ref 0.0–5.0)
HCT: 44.6 % (ref 39.0–52.0)
Hemoglobin: 15.1 g/dL (ref 13.0–17.0)
Lymphocytes Relative: 42.3 % (ref 12.0–46.0)
Lymphs Abs: 3 10*3/uL (ref 0.7–4.0)
MCHC: 33.8 g/dL (ref 30.0–36.0)
MCV: 86.6 fl (ref 78.0–100.0)
Monocytes Absolute: 0.7 10*3/uL (ref 0.1–1.0)
Monocytes Relative: 9.2 % (ref 3.0–12.0)
Neutro Abs: 3.3 10*3/uL (ref 1.4–7.7)
Neutrophils Relative %: 46.2 % (ref 43.0–77.0)
Platelets: 254 10*3/uL (ref 150.0–400.0)
RBC: 5.15 Mil/uL (ref 4.22–5.81)
RDW: 13 % (ref 11.5–15.5)
WBC: 7.1 10*3/uL (ref 4.0–10.5)

## 2020-10-16 LAB — URINALYSIS
Bilirubin Urine: NEGATIVE
Hgb urine dipstick: NEGATIVE
Ketones, ur: NEGATIVE
Leukocytes,Ua: NEGATIVE
Nitrite: NEGATIVE
Specific Gravity, Urine: 1.02 (ref 1.000–1.030)
Total Protein, Urine: NEGATIVE
Urine Glucose: NEGATIVE
Urobilinogen, UA: 0.2 (ref 0.0–1.0)
pH: 6 (ref 5.0–8.0)

## 2020-10-16 LAB — COMPREHENSIVE METABOLIC PANEL
ALT: 14 U/L (ref 0–53)
AST: 17 U/L (ref 0–37)
Albumin: 4.4 g/dL (ref 3.5–5.2)
Alkaline Phosphatase: 43 U/L (ref 39–117)
BUN: 14 mg/dL (ref 6–23)
CO2: 26 mEq/L (ref 19–32)
Calcium: 9.5 mg/dL (ref 8.4–10.5)
Chloride: 105 mEq/L (ref 96–112)
Creatinine, Ser: 1.07 mg/dL (ref 0.40–1.50)
GFR: 87.3 mL/min (ref >60.00)
Glucose, Bld: 91 mg/dL (ref 70–99)
Potassium: 4 mEq/L (ref 3.5–5.1)
Sodium: 139 mEq/L (ref 135–145)
Total Bilirubin: 0.6 mg/dL (ref 0.2–1.2)
Total Protein: 7 g/dL (ref 6.0–8.3)

## 2020-10-16 LAB — LIPID PANEL
Cholesterol: 177 mg/dL (ref 0–200)
HDL: 41.6 mg/dL (ref >39.00)
LDL Cholesterol: 115 mg/dL — ABNORMAL HIGH (ref 0–99)
NonHDL: 135.58
Total CHOL/HDL Ratio: 4
Triglycerides: 105 mg/dL (ref 0.0–149.0)
VLDL: 21 mg/dL (ref 0.0–40.0)

## 2020-10-16 LAB — TSH: TSH: 2.22 u[IU]/mL (ref 0.35–5.50)

## 2020-10-16 NOTE — Patient Instructions (Signed)
For a mild COVID-19 case - take zinc 50 mg a day for 1 week, vitamin C 1000 mg daily for 1 week, vitamin D2 50,000 units weekly for 2 months (unless  taking vitamin D daily already), an antioxidant Quercetin 500 mg twice a day for 1 week (if you can get it quick enough). Take Allegra or Benadryl.  Maintain good oral hydration and take Tylenol for high fever.  Call if problems. Isolate for 5 days, then wear a mask for 5 days per CDC.  

## 2020-10-16 NOTE — Progress Notes (Signed)
Subjective:  Patient ID: Ricky [name], male    DOB: June 15, 1980  Age: 40 y.o. MRN: [id number]  CC: Annual Exam   HPI Ricky [name] presents for a well exam  Outpatient Medications Prior to Visit  Medication Sig Dispense Refill   cholecalciferol (VITAMIN D) 1000 units tablet Take 1,000 Units by mouth daily.     Omega-3 Fatty Acids (FISH OIL PO) Take by mouth.     meloxicam (MOBIC) 15 MG tablet Take 1 tablet (15 mg total) by mouth daily. (Patient not taking: Reported on 10/16/2020) 30 tablet 0   No facility-administered medications prior to visit.    ROS: Review of Systems  Constitutional:  Negative for appetite change, fatigue and unexpected weight change.  HENT:  Negative for congestion, nosebleeds, sneezing, sore throat and trouble swallowing.   Eyes:  Negative for itching and visual disturbance.  Respiratory:  Negative for cough.   Cardiovascular:  Negative for chest pain, palpitations and leg swelling.  Gastrointestinal:  Negative for abdominal distention, blood in stool, diarrhea and nausea.  Genitourinary:  Negative for frequency and hematuria.  Musculoskeletal:  Negative for back pain, gait problem, joint swelling and neck pain.  Skin:  Negative for rash.  Neurological:  Negative for dizziness, tremors, speech difficulty and weakness.  Psychiatric/Behavioral:  Negative for agitation, dysphoric mood and sleep disturbance. The patient is not nervous/anxious.    Objective:  BP 110/78 (BP Location: Left Arm)   Pulse (!) 52   Temp 98.1 F (36.7 C) (Oral)   Ht 5\' 11"  (1.803 m)   Wt 253 lb 12.8 oz (115.1 kg)   SpO2 97%   BMI 35.40 kg/m   BP Readings from Last 3 Encounters:  10/16/20 110/78  10/19/19 110/80  09/20/19 120/78    Wt Readings from Last 3 Encounters:  10/16/20 253 lb 12.8 oz (115.1 kg)  10/19/19 262 lb (118.8 kg)  09/20/19 266 lb (120.7 kg)    Physical Exam Constitutional:      General: He is not in acute distress.    Appearance: He is  well-developed. He is obese.     Comments: NAD  Eyes:     Conjunctiva/sclera: Conjunctivae normal.     Pupils: Pupils are equal, round, and reactive to light.  Neck:     Thyroid: No thyromegaly.     Vascular: No JVD.  Cardiovascular:     Rate and Rhythm: Normal rate and regular rhythm.     Heart sounds: Normal heart sounds. No murmur heard.   No friction rub. No gallop.  Pulmonary:     Effort: Pulmonary effort is normal. No respiratory distress.     Breath sounds: Normal breath sounds. No wheezing or rales.  Chest:     Chest wall: No tenderness.  Abdominal:     General: Bowel sounds are normal. There is no distension.     Palpations: Abdomen is soft. There is no mass.     Tenderness: There is no abdominal tenderness. There is no guarding or rebound.  Musculoskeletal:        General: No tenderness. Normal range of motion.     Cervical back: Normal range of motion.  Lymphadenopathy:     Cervical: No cervical adenopathy.  Skin:    General: Skin is warm and dry.     Findings: No rash.  Neurological:     Mental Status: He is alert and oriented to person, place, and time.     Cranial Nerves: No cranial nerve deficit.  Motor: No abnormal muscle tone.     Coordination: Coordination normal.     Gait: Gait normal.     Deep Tendon Reflexes: Reflexes are normal and symmetric.  Psychiatric:        Behavior: Behavior normal.        Thought Content: Thought content normal.        Judgment: Judgment normal.   Testes -- self-exam Lab Results  Component Value Date   WBC 8.1 09/03/2016   HGB 14.9 09/03/2016   HCT 44.0 09/03/2016   PLT 274.0 09/03/2016   GLUCOSE 92 09/03/2016   CHOL 174 09/03/2016   TRIG 99.0 09/03/2016   HDL 37.10 (L) 09/03/2016   LDLCALC 117 (H) 09/03/2016   ALT 34 09/03/2016   AST 22 09/03/2016   NA 140 09/03/2016   K 4.2 09/03/2016   CL 106 09/03/2016   CREATININE 1.04 09/03/2016   BUN 10 09/03/2016   CO2 25 09/03/2016   TSH 2.07 09/03/2016    DG  Thoracic Spine W/Swimmers  Result Date: 10/27/2013 CLINICAL DATA:  Back pain EXAM: THORACIC SPINE - 2 VIEW + SWIMMERS COMPARISON:  None. FINDINGS: Frontal, lateral, and swimmer's views were obtained. There is anterior wedging of the the T10, T11, T12, L1, and L2 vertebral bodies with the greatest degree of wedging at T11 and T12. There is increased kyphosis in the lower thoracic region. No spondylolisthesis. There is mild disc space narrowing at multiple levels. IMPRESSION: Multiple lower thoracic and upper lumbar compression fractures, age uncertain. Increase kyphosis in the lower thoracic region, apparently due to these areas of compression. No spondylolisthesis. 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 Electronically Signed   By: Lowella Grip M.D.   On: 10/27/2013 09:10   DG Lumbar Spine Complete  Result Date: 10/27/2013 CLINICAL DATA:  Scoliosis, back pain EXAM: LUMBAR SPINE - COMPLETE 4+ VIEW COMPARISON:  None. FINDINGS: Five views of lumbar spine submitted. No acute fracture or subluxation. Mild anterior spurring upper and lower endplate of L1 vertebral body. There is mild disc space flattening with anterior spurring at L1-L2 level. Mild disc space flattening at T12-L1 level. Mild disc space flattening at L5-S1 level. There is mild compression deformity upper endplate of L27 vertebral body. Moderate compression deformity T10 and T11 vertebral bodies. These are of indeterminate age. Clinical correlation is necessary. IMPRESSION: Degenerative changes as described above. There is mild compression deformity upper endplate of N17 vertebral body. Moderate compression deformity T10 and T11 vertebral bodies. These are of indeterminate age. Clinical correlation is necessary. Electronically Signed   By: Lahoma Crocker M.D.   On: 10/27/2013 09:25    Assessment & Plan:     [name], MD

## 2020-10-16 NOTE — Assessment & Plan Note (Signed)

## 2020-10-22 ENCOUNTER — Ambulatory Visit (INDEPENDENT_AMBULATORY_CARE_PROVIDER_SITE_OTHER): Payer: BC Managed Care – PPO | Admitting: Dermatology

## 2020-10-22 ENCOUNTER — Other Ambulatory Visit: Payer: Self-pay

## 2020-10-22 ENCOUNTER — Encounter: Payer: Self-pay | Admitting: Dermatology

## 2020-10-22 DIAGNOSIS — D1801 Hemangioma of skin and subcutaneous tissue: Secondary | ICD-10-CM

## 2020-10-22 DIAGNOSIS — D235 Other benign neoplasm of skin of trunk: Secondary | ICD-10-CM | POA: Diagnosis not present

## 2020-10-22 DIAGNOSIS — D239 Other benign neoplasm of skin, unspecified: Secondary | ICD-10-CM

## 2020-10-22 DIAGNOSIS — Z1283 Encounter for screening for malignant neoplasm of skin: Secondary | ICD-10-CM | POA: Diagnosis not present

## 2020-10-22 DIAGNOSIS — L918 Other hypertrophic disorders of the skin: Secondary | ICD-10-CM

## 2020-11-08 ENCOUNTER — Encounter: Payer: Self-pay | Admitting: Dermatology

## 2020-11-08 NOTE — Progress Notes (Signed)
   Follow-Up Visit   Subjective  Ricky [name] is a 40 y.o. male who presents for the following: Annual Exam (No history of mm atypia or skin cancer, skin tags around the neck and groin area blood spots ).  Skin examination several spots to check Location:  Duration:  Quality:  Associated Signs/Symptoms: Modifying Factors:  Severity:  Timing: Context:   Objective  Well appearing patient in no apparent distress; mood and affect are within normal limits. Multiple 1 mm red dermal papules  Right Lower Back Full body skin exam.  Typical pigmented lesions or nonmelanoma skin cancer.  Anterior Scrotum, Left Scrotum 2 mm noninflamed blue superficial dermal scrotal papules  Neck - Anterior (3) Fleshy, skin-colored  pedunculated papules.      A full examination was performed including scalp, head, eyes, ears, nose, lips, neck, chest, axillae, abdomen, back, buttocks, bilateral upper extremities, bilateral lower extremities, hands, feet, fingers, toes, fingernails, and toenails. All findings within normal limits unless otherwise noted below.   Assessment & Plan    Screening for malignant neoplasm of skin Right Lower Back  Yearly skin exams.  Encouraged to self examine twice annually.  Continue ultraviolet protection.  Angiokeratoma (2) Left Scrotum; Anterior Scrotum  Patient told that these are benign and rarely would result in bleeding.  If they are bothersome, I will refer him to [facility] for laser.  Hemangioma of skin  Benign no treatment needed.  Skin tag (3) Neck - Anterior  Benign no treatment       I, Lavonna Monarch, MD, have reviewed all documentation for this visit.  The documentation on 11/08/20 for the exam, diagnosis, procedures, and orders are all accurate and complete.

## 2020-12-10 NOTE — Progress Notes (Signed)
Ricky [name] [place] [address] [place] Phone: [phone] Subjective:   IVilma [name], am serving as a scribe for Dr. Hulan [name]. This visit occurred during the SARS-CoV-2 public health emergency.  Safety protocols were in place, including screening questions prior to the visit, additional usage of staff PPE, and extensive cleaning of exam room while observing appropriate contact time as indicated for disinfecting solutions.   I'm seeing this patient by the request  of:  Ricky [name], Ricky Lacks, MD  CC: Right shoulder follow-up and new left hip pain  [id number]  10/19/2019 Significant improvement overall.  Continue with conservative therapy at this time.  Discussed the possibility of the meloxicam refilled again.  Follow-up with me 3 months to make sure completely resolved  Update 12/11/2020 Ricky [name] is a 40 y.o. male coming in with complaint of R shoulder pain. Patient states his shoulder is doing well. He only feels it when he sleeps on it all night, but its only for a little while. He does have a question about some left groin pain that he only feels in the morning. Patient states it only last 2 minutes.  Patient denies any bulge, any association with bowel movements or any type of hematuria.  Patient states otherwise continue everything else.  Has been working out on a more regular basis and has noticed tightness with working out but nothing severe.  Patient states seems to have more discomfort with abduction.     No past medical history on file. No past surgical history on file. Social History   Socioeconomic History   Marital status: Married    Spouse name: Not on file   Number of children: Not on file   Years of education: Not on file   Highest education level: Not on file  Occupational History   Not on file  Tobacco Use   Smoking status: Former   Smokeless tobacco: Never  Vaping Use   Vaping Use: Never used  Substance  and Sexual Activity   Alcohol use: Yes   Drug use: No   Sexual activity: Not on file  Other Topics Concern   Not on file  Social History Narrative   Not on file   Social Determinants of Health   Financial Resource Strain: Not on file  Food Insecurity: Not on file  Transportation Needs: Not on file  Physical Activity: Not on file  Stress: Not on file  Social Connections: Not on file   No Known Allergies Family History  Problem Relation Age of Onset   Breast cancer Other    Hyperlipidemia Other    Stroke Other    Birth defects Other    Heart disease Other    Hypertension Other    Diabetes Other    Glaucoma Father          Current Outpatient Medications (Other):    cholecalciferol (VITAMIN D) 1000 units tablet, Take 1,000 Units by mouth daily.   Omega-3 Fatty Acids (FISH OIL PO), Take by mouth.   Reviewed prior external information including notes and imaging from  primary care provider As well as notes that were available from care everywhere and other healthcare systems.  Past medical history, social, surgical and family history all reviewed in electronic medical record.  No pertanent information unless stated regarding to the chief complaint.   Review of Systems:  No headache, visual changes, nausea, vomiting, diarrhea, constipation, dizziness, abdominal pain, skin rash, fevers, chills, night sweats, weight  loss, swollen lymph nodes, body aches, joint swelling, chest pain, shortness of breath, mood changes. POSITIVE muscle aches  Objective  Blood pressure 118/80, pulse 70, height '5\' 11"'$  (1.803 m), weight 259 lb (117.5 kg), SpO2 98 %.   General: No apparent distress alert and oriented x3 mood and affect normal, dressed appropriately.  HEENT: Pupils equal, extraocular movements intact  Respiratory: Patient's speak in full sentences and does not appear short of breath  Cardiovascular: No lower extremity edema, non tender, no erythema  Gait normal with good  balance and coordination.  MSK: Shoulder: Right Inspection reveals no abnormalities, atrophy or asymmetry. Palpation is normal with no tenderness over AC joint or bicipital groove. ROM is full in all planes. Rotator cuff strength normal throughout. No signs of impingement with negative Neer and Hawkin's tests, empty can sign. Speeds and Yergason's tests normal. No labral pathology noted with negative Obrien's, negative clunk and good stability. Normal scapular function observed. No painful arc and no drop arm sign. No apprehension sign  Left hip exam has good range of motion.  Patient does have some mild pain in the groin area with Corky Sox.  Patient has negative pain with internal rotation.  Full flexion of the hip noted.  Negative straight leg test.  [id number]; 15 additional minutes spent for Therapeutic exercises as stated in above notes.  This included exercises focusing on stretching, strengthening, with significant focus on eccentric aspects.   Long term goals include an improvement in range of motion, strength, endurance as well as avoiding reinjury. Patient's frequency would include in 1-2 times a day, 3-5 times a week for a duration of 6-12 weeks. Hip strengthening exercises which included:  Pelvic tilt/bracing to help with proper recruitment of the lower abs and pelvic floor muscles  Glute strengthening to properly contract glutes without over-engaging low back and hamstrings - prone hip extension and glute bridge exercises Proper stretching techniques to increase effectiveness for the hip flexors, groin, quads, piriformic and low back when appropriate   Proper technique shown and discussed handout in great detail with ATC.  All questions were discussed and answered.      Impression and Recommendations:     The above documentation has been reviewed and is accurate and complete Ricky Pulley, DO

## 2020-12-12 ENCOUNTER — Other Ambulatory Visit: Payer: Self-pay

## 2020-12-12 ENCOUNTER — Encounter: Payer: Self-pay | Admitting: Family Medicine

## 2020-12-12 ENCOUNTER — Ambulatory Visit (INDEPENDENT_AMBULATORY_CARE_PROVIDER_SITE_OTHER): Payer: BC Managed Care – PPO

## 2020-12-12 ENCOUNTER — Ambulatory Visit (INDEPENDENT_AMBULATORY_CARE_PROVIDER_SITE_OTHER): Payer: BC Managed Care – PPO | Admitting: Family Medicine

## 2020-12-12 VITALS — BP 118/80 | HR 70 | Ht 71.0 in | Wt 259.0 lb

## 2020-12-12 DIAGNOSIS — M25552 Pain in left hip: Secondary | ICD-10-CM

## 2020-12-12 DIAGNOSIS — M7551 Bursitis of right shoulder: Secondary | ICD-10-CM | POA: Diagnosis not present

## 2020-12-12 DIAGNOSIS — R1032 Left lower quadrant pain: Secondary | ICD-10-CM | POA: Diagnosis not present

## 2020-12-12 NOTE — Assessment & Plan Note (Signed)
Nearly completely resolved at this time.  No significant changes.  No need to follow-up as long as patient continues to make improvement.

## 2020-12-12 NOTE — Assessment & Plan Note (Signed)
Patient has had left groin pain.  Discussed icing regimen and home exercises.  Patient is only having it though in the mornings.  We will get x-rays to further evaluate for any underlying arthritic changes or cam deformity that could be contributing but I think it is highly unlikely.  Patient has been more active and is likely overstretching.  Given home exercises and work with Product/process development scientist as well as will do a thigh compression sleeve.  Worsening pain patient will see me.  Patient given warning signs for anything such as a hernia but he is stating no significant findings.  Follow-up with me again in 6 to 8 weeks

## 2020-12-12 NOTE — Patient Instructions (Signed)
Body helix compression sleeve to wear with activity Do prescribed exercises at least 3x a week See you again in 2 months

## 2021-01-27 ENCOUNTER — Telehealth: Payer: Self-pay | Admitting: Internal Medicine

## 2021-01-27 NOTE — Telephone Encounter (Signed)
Daughter tested positive for flu today, nutritionist recommended he get a script for tamiflu since he hasn't gotten his flu vaccine  [place], [place] - [address] Phone:  [phone]  Fax:  [phone]     Please follow-up with the patient at [phone]

## 2021-01-28 MED ORDER — OSELTAMIVIR PHOSPHATE 75 MG PO CAPS: 75.0000 mg | ORAL_CAPSULE | Freq: Every day | ORAL | 0 refills | Status: DC

## 2021-01-28 NOTE — Telephone Encounter (Signed)
Okay.  Take 1 once a day for 10 days for influenza prophylaxis.  Thanks

## 2021-01-29 NOTE — Telephone Encounter (Signed)
Notified pt MD sent rx to [facility] 

## 2021-02-27 ENCOUNTER — Ambulatory Visit: Payer: BC Managed Care – PPO | Admitting: Family Medicine

## 2021-05-28 ENCOUNTER — Ambulatory Visit: Payer: BC Managed Care – PPO | Admitting: Family Medicine

## 2021-08-28 ENCOUNTER — Ambulatory Visit: Payer: BC Managed Care – PPO | Admitting: Family Medicine

## 2021-09-25 ENCOUNTER — Other Ambulatory Visit: Payer: Self-pay

## 2021-09-25 ENCOUNTER — Telehealth: Payer: Self-pay | Admitting: Family Medicine

## 2021-09-25 ENCOUNTER — Ambulatory Visit (INDEPENDENT_AMBULATORY_CARE_PROVIDER_SITE_OTHER): Payer: 59

## 2021-09-25 DIAGNOSIS — M25572 Pain in left ankle and joints of left foot: Secondary | ICD-10-CM

## 2021-09-25 NOTE — Telephone Encounter (Signed)
X-ray ordered.

## 2021-09-25 NOTE — Telephone Encounter (Signed)
Patient called stating that he thinks he has sprained his left ankle. Due to scheduling issues, and per Tamala Julian, he would like to have an xray done. (Planning to be here around 1130) Left ankle, he can bear weight but it is very swollen.

## 2022-07-13 NOTE — Progress Notes (Unsigned)
[facility] [address] [place] [address] Phone: [phone] Subjective:   Ricky [name], am serving as a scribe for Dr. Antoine Primas.  I'm seeing this patient by the request  of:  Plotnikov, Georgina Quint, MD  CC: ankle and shoulder pain f/u   [id number]  Last seen Sept 2002 for groin and shoulder pain  Updated 07/14/2022 Ricky [name] is a 42 y.o. male coming in with complaint of ankle and shoulder pain. Patient states that he sprained L ankle last summer. Still has achy pain under lateral malleolus with walking or gardening.   Pain in R shoulder over anterior aspect. Also has pain in mornings after sleeping on that side. Weakness with flexion. Pain when arm is overhead. Denies any numbness or tingling.    Xray 09/26/21 of ankle on left showed soft tissue swelling   No past medical history on file. No past surgical history on file. Social History   Socioeconomic History   Marital status: Married    Spouse name: Not on file   Number of children: Not on file   Years of education: Not on file   Highest education level: Not on file  Occupational History   Not on file  Tobacco Use   Smoking status: Former   Smokeless tobacco: Never  Vaping Use   Vaping Use: Never used  Substance and Sexual Activity   Alcohol use: Yes   Drug use: No   Sexual activity: Not on file  Other Topics Concern   Not on file  Social History Narrative   Not on file   Social Determinants of Health   Financial Resource Strain: Not on file  Food Insecurity: Not on file  Transportation Needs: Not on file  Physical Activity: Not on file  Stress: Not on file  Social Connections: Not on file   No Known Allergies Family History  Problem Relation Age of Onset   Breast cancer Other    Hyperlipidemia Other    Stroke Other    Birth defects Other    Heart disease Other    Hypertension Other    Diabetes Other    Glaucoma Father        Current  Outpatient Medications (Analgesics):    meloxicam (MOBIC) 15 MG tablet, Take 1 tablet (15 mg total) by mouth daily.   Current Outpatient Medications (Other):    cholecalciferol (VITAMIN D) 1000 units tablet, Take 1,000 Units by mouth daily.   Omega-3 Fatty Acids (FISH OIL PO), Take by mouth.   Reviewed prior external information including notes and imaging from  primary care provider As well as notes that were available from care everywhere and other healthcare systems.  Past medical history, social, surgical and family history all reviewed in electronic medical record.  No pertanent information unless stated regarding to the chief complaint.   Review of Systems:  No headache, visual changes, nausea, vomiting, diarrhea, constipation, dizziness, abdominal pain, skin rash, fevers, chills, night sweats, weight loss, swollen lymph nodes, body aches, joint swelling, chest pain, shortness of breath, mood changes. POSITIVE muscle aches  Objective  Blood pressure 118/82, pulse 71, height  (1.803 m), weight 264 lb (119.7 kg), SpO2 97 %.   General: No apparent distress alert and oriented x3 mood and affect normal, dressed appropriately.  HEENT: Pupils equal, extraocular movements intact  Respiratory: Patient's speak in full sentences and does not appear short of breath  Cardiovascular: No lower extremity edema, non tender, no erythema  Ankle shows patient does have some very mild swelling over the sinus tarsi.  Patient does have some laxity of the lateral aspect of the ankle.  No audible popping noted of the peroneal tendons.  Shoulder shows patient does still have positive impingement noted.  Rotator cuff strength is 5 out of 5.  Good range of motion noted.  Limited muscular skeletal ultrasound was performed and interpreted by Antoine Primas, M   Limited ultrasound does show the patient does have subluxation of the peroneal tendons noted with some mild hypoechoic changes.  Patient does  have a healing lateral avulsion fracture noted of the lateral malleolus.  Does seem to have some chronic tearing of the ATFL. Impression: Chronic ATFL injury and peroneal tendinitis   Impression and Recommendations:    The above documentation has been reviewed and is accurate and complete Judi Saa, DO

## 2022-07-14 ENCOUNTER — Ambulatory Visit (INDEPENDENT_AMBULATORY_CARE_PROVIDER_SITE_OTHER): Payer: 59 | Admitting: Family Medicine

## 2022-07-14 ENCOUNTER — Other Ambulatory Visit: Payer: Self-pay

## 2022-07-14 VITALS — BP 118/82 | HR 71 | Ht 71.0 in | Wt 264.0 lb

## 2022-07-14 DIAGNOSIS — M7551 Bursitis of right shoulder: Secondary | ICD-10-CM

## 2022-07-14 DIAGNOSIS — M7672 Peroneal tendinitis, left leg: Secondary | ICD-10-CM | POA: Diagnosis not present

## 2022-07-14 DIAGNOSIS — G8929 Other chronic pain: Secondary | ICD-10-CM | POA: Diagnosis not present

## 2022-07-14 DIAGNOSIS — M25572 Pain in left ankle and joints of left foot: Secondary | ICD-10-CM | POA: Diagnosis not present

## 2022-07-14 MED ORDER — MELOXICAM 15 MG PO TABS: 15.0000 mg | ORAL_TABLET | Freq: Every day | ORAL | 0 refills | Status: DC

## 2022-07-14 NOTE — Patient Instructions (Signed)
Use standing desk Vertical mouse Meloxicam  daily for days then as needed Ankle heel lift Recovery sandals Exercises for ankle See me in 7-8 weeks

## 2022-07-15 ENCOUNTER — Encounter: Payer: Self-pay | Admitting: Family Medicine

## 2022-07-15 DIAGNOSIS — M7672 Peroneal tendinitis, left leg: Secondary | ICD-10-CM | POA: Insufficient documentation

## 2022-07-15 NOTE — Assessment & Plan Note (Signed)
Recurrent bursitis.  Discussed with patient at great length.  Still wants to hold on any type of injection.  Discussed ergonomics including a vertical mouse that could be helpful.  Discussed working environment.  Worsening pain consider the possibility of osteopathic manipulation, formal physical therapy or the possibility of injection.

## 2022-07-15 NOTE — Assessment & Plan Note (Addendum)
Discussed home exercises with athletic trainer, recovery sandals, proper shoes and compression.  Follow-up with me again in 6 weeks if worsening pain consider formal physical therapy or possible injections meloxicam prescribed.

## 2022-07-23 ENCOUNTER — Ambulatory Visit: Payer: 59 | Admitting: Family Medicine

## 2022-08-06 ENCOUNTER — Ambulatory Visit: Payer: 59 | Admitting: Family Medicine

## 2022-09-01 NOTE — Progress Notes (Unsigned)
[facility] [address] [place] [address] Phone: [phone] Subjective:   Ricky [name], am serving as a scribe for Dr. Antoine Primas.  I'm seeing this patient by the request  of:  Plotnikov, Georgina Quint, MD  CC: Shoulder and ankle pain follow-up  ION:[id number]  07/14/2022 Discussed home exercises with athletic trainer, recovery sandals, proper shoes and compression.  Follow-up with me again in 6 weeks if worsening pain consider formal physical therapy or possible injections meloxicam prescribed.     Recurrent bursitis. Discussed with patient at great length. Still wants to hold on any type of injection. Discussed ergonomics including a vertical mouse that could be helpful. Discussed working environment. Worsening pain consider the possibility of osteopathic manipulation, formal physical therapy or the possibility of injection.   Update 09/03/2022 Ricky [name] is a 42 y.o. male coming in with complaint of R shoulder and L foot pain. Patient states that he is doing better. Still has slight pain in both areas from time to time. Shoulder flexion produces minimal pain.  Overall would state that he is feeling 80 to 85% better.     No past medical history on file. No past surgical history on file. Social History   Socioeconomic History   Marital status: Married    Spouse name: Not on file   Number of children: Not on file   Years of education: Not on file   Highest education level: Not on file  Occupational History   Not on file  Tobacco Use   Smoking status: Former   Smokeless tobacco: Never  Vaping Use   Vaping Use: Never used  Substance and Sexual Activity   Alcohol use: Yes   Drug use: No   Sexual activity: Not on file  Other Topics Concern   Not on file  Social History Narrative   Not on file   Social Determinants of Health   Financial Resource Strain: Not on file  Food Insecurity: Not on file  Transportation Needs: Not on  file  Physical Activity: Not on file  Stress: Not on file  Social Connections: Not on file   No Known Allergies Family History  Problem Relation Age of Onset   Breast cancer Other    Hyperlipidemia Other    Stroke Other    Birth defects Other    Heart disease Other    Hypertension Other    Diabetes Other    Glaucoma Father        Current Outpatient Medications (Analgesics):    meloxicam (MOBIC) 15 MG tablet, Take 1 tablet (15 mg total) by mouth daily.   Current Outpatient Medications (Other):    cholecalciferol (VITAMIN D) 1000 units tablet, Take 1,000 Units by mouth daily.   Omega-3 Fatty Acids (FISH OIL PO), Take by mouth.   Reviewed prior external information including notes and imaging from  primary care provider As well as notes that were available from care everywhere and other healthcare systems.  Past medical history, social, surgical and family history all reviewed in electronic medical record.  No pertanent information unless stated regarding to the chief complaint.   Review of Systems:  No headache, visual changes, nausea, vomiting, diarrhea, constipation, dizziness, abdominal pain, skin rash, fevers, chills, night sweats, weight loss, swollen lymph nodes, body aches, joint swelling, chest pain, shortness of breath, mood changes. POSITIVE muscle aches  Objective  Blood pressure 112/80, pulse 71, height 5\' 11"  (1.803 m), weight 261 lb (118.4 kg), SpO2 96 %.  General: No apparent distress alert and oriented x3 mood and affect normal, dressed appropriately.  HEENT: Pupils equal, extraocular movements intact  Respiratory: Patient's speak in full sentences and does not appear short of breath  Cardiovascular: No lower extremity edema, non tender, no erythema  Foot exam on the left side does have some crepitus noted but good strength noted.  Nontender on exam.  No tightness of the posterior cord  Right shoulder exam still has positive crossover noted.  Rotator  cuff strength 5 out of 5 noted.  Limited muscular skeletal ultrasound was performed and interpreted by Antoine Primas, M  Limited ultrasound does show hypoechoic changes of the acromioclavicular joint but otherwise fairly unremarkable. Impression: Improvement    Impression and Recommendations:    The above documentation has been reviewed and is accurate and complete Judi Saa, DO

## 2022-09-03 ENCOUNTER — Encounter: Payer: Self-pay | Admitting: Family Medicine

## 2022-09-03 ENCOUNTER — Ambulatory Visit (INDEPENDENT_AMBULATORY_CARE_PROVIDER_SITE_OTHER): Payer: 59 | Admitting: Family Medicine

## 2022-09-03 ENCOUNTER — Other Ambulatory Visit: Payer: Self-pay

## 2022-09-03 VITALS — BP 112/80 | HR 71 | Ht 71.0 in | Wt 261.0 lb

## 2022-09-03 DIAGNOSIS — M7672 Peroneal tendinitis, left leg: Secondary | ICD-10-CM

## 2022-09-03 DIAGNOSIS — M79672 Pain in left foot: Secondary | ICD-10-CM | POA: Diagnosis not present

## 2022-09-03 DIAGNOSIS — M7551 Bursitis of right shoulder: Secondary | ICD-10-CM | POA: Diagnosis not present

## 2022-09-03 MED ORDER — MELOXICAM 15 MG PO TABS: 15.0000 mg | ORAL_TABLET | Freq: Every day | ORAL | 0 refills | Status: AC

## 2022-09-03 NOTE — Assessment & Plan Note (Signed)
Patient previously did have a bursitis and does have hypoechoic changes of the acromioclavicular joint at the moment.  Discussed with patient about icing regimen and home exercises, which activities to do and which ones to avoid.  Increase activity slowly over the course of next several weeks.  Follow-up with me again in 6 to 8 weeks

## 2022-09-03 NOTE — Assessment & Plan Note (Signed)
Still has some crepitus that seems to be more of the peroneal tendons.  Discussed the still continue with the strengthening exercises.  Can follow-up with me as needed

## 2022-09-03 NOTE — Patient Instructions (Signed)
Great to see you Keep hands in peripheral vision Meloxicam refilled See me when you need me

## 2023-03-03 ENCOUNTER — Encounter: Payer: Self-pay | Admitting: Internal Medicine

## 2023-03-03 ENCOUNTER — Ambulatory Visit: Payer: 59 | Admitting: Internal Medicine

## 2023-03-03 VITALS — BP 120/70 | HR 71 | Temp 98.2°F | Ht 71.0 in | Wt 262.0 lb

## 2023-03-03 DIAGNOSIS — Z0000 Encounter for general adult medical examination without abnormal findings: Secondary | ICD-10-CM

## 2023-03-03 LAB — CBC WITH DIFFERENTIAL/PLATELET
Basophils Absolute: 0 10*3/uL (ref 0.0–0.1)
Basophils Relative: 0.4 % (ref 0.0–3.0)
Eosinophils Absolute: 0.1 10*3/uL (ref 0.0–0.7)
Eosinophils Relative: 1 % (ref 0.0–5.0)
HCT: 46.1 % (ref 39.0–52.0)
Hemoglobin: 15.4 g/dL (ref 13.0–17.0)
Lymphocytes Relative: 33.7 % (ref 12.0–46.0)
Lymphs Abs: 2.4 10*3/uL (ref 0.7–4.0)
MCHC: 33.5 g/dL (ref 30.0–36.0)
MCV: 87.6 fL (ref 78.0–100.0)
Monocytes Absolute: 0.6 10*3/uL (ref 0.1–1.0)
Monocytes Relative: 8.4 % (ref 3.0–12.0)
Neutro Abs: 4 10*3/uL (ref 1.4–7.7)
Neutrophils Relative %: 56.5 % (ref 43.0–77.0)
Platelets: 256 10*3/uL (ref 150.0–400.0)
RBC: 5.27 Mil/uL (ref 4.22–5.81)
RDW: 13.1 % (ref 11.5–15.5)
WBC: 7.1 10*3/uL (ref 4.0–10.5)

## 2023-03-03 LAB — COMPREHENSIVE METABOLIC PANEL
ALT: 31 U/L (ref 0–53)
AST: 22 U/L (ref 0–37)
Albumin: 4.5 g/dL (ref 3.5–5.2)
Alkaline Phosphatase: 55 U/L (ref 39–117)
BUN: 13 mg/dL (ref 6–23)
CO2: 27 meq/L (ref 19–32)
Calcium: 9.5 mg/dL (ref 8.4–10.5)
Chloride: 104 meq/L (ref 96–112)
Creatinine, Ser: 1 mg/dL (ref 0.40–1.50)
GFR: 93.12 mL/min (ref >60.00)
Glucose, Bld: 104 mg/dL — ABNORMAL HIGH (ref 70–99)
Potassium: 4.2 meq/L (ref 3.5–5.1)
Sodium: 140 meq/L (ref 135–145)
Total Bilirubin: 0.7 mg/dL (ref 0.2–1.2)
Total Protein: 7.2 g/dL (ref 6.0–8.3)

## 2023-03-03 LAB — LIPID PANEL
Cholesterol: 209 mg/dL — ABNORMAL HIGH (ref 0–200)
HDL: 38 mg/dL — ABNORMAL LOW (ref >39.00)
LDL Cholesterol: 140 mg/dL — ABNORMAL HIGH (ref 0–99)
NonHDL: 170.83
Total CHOL/HDL Ratio: 5
Triglycerides: 154 mg/dL — ABNORMAL HIGH (ref 0.0–149.0)
VLDL: 30.8 mg/dL (ref 0.0–40.0)

## 2023-03-03 LAB — URINALYSIS
Bilirubin Urine: NEGATIVE
Hgb urine dipstick: NEGATIVE
Ketones, ur: NEGATIVE
Leukocytes,Ua: NEGATIVE
Nitrite: NEGATIVE
Specific Gravity, Urine: 1.02 (ref 1.000–1.030)
Total Protein, Urine: NEGATIVE
Urine Glucose: NEGATIVE
Urobilinogen, UA: 0.2 (ref 0.0–1.0)
pH: 7 (ref 5.0–8.0)

## 2023-03-03 LAB — TSH: TSH: 2.04 u[IU]/mL (ref 0.35–5.50)

## 2023-03-03 LAB — PSA: PSA: 0.54 ng/mL (ref 0.10–4.00)

## 2023-03-03 NOTE — Assessment & Plan Note (Signed)

## 2023-03-03 NOTE — Progress Notes (Signed)
Subjective:  Patient ID: Ricky [name], male    DOB: 05/11/1980  Age: 42 y.o. MRN: [id number]  CC: Annual Exam   HPI Ricky [name] presents for a well exam  Outpatient Medications Prior to Visit  Medication Sig Dispense Refill   cholecalciferol (VITAMIN D) 1000 units tablet Take 1,000 Units by mouth daily.     meloxicam (MOBIC) 15 MG tablet Take 1 tablet (15 mg total) by mouth daily. 90 tablet 0   Omega-3 Fatty Acids (FISH OIL PO) Take by mouth.     No facility-administered medications prior to visit.    ROS: Review of Systems  Constitutional:  Negative for appetite change, fatigue and unexpected weight change.  HENT:  Negative for congestion, nosebleeds, sneezing, sore throat and trouble swallowing.   Eyes:  Negative for itching and visual disturbance.  Respiratory:  Negative for cough.   Cardiovascular:  Negative for chest pain, palpitations and leg swelling.  Gastrointestinal:  Negative for abdominal distention, blood in stool, diarrhea and nausea.  Genitourinary:  Negative for frequency and hematuria.  Musculoskeletal:  Negative for back pain, gait problem, joint swelling and neck pain.  Skin:  Negative for rash.  Neurological:  Negative for dizziness, tremors, speech difficulty and weakness.  Psychiatric/Behavioral:  Negative for agitation, dysphoric mood and sleep disturbance. The patient is not nervous/anxious.     Objective:  BP 120/70 (BP Location: Left Arm, Patient Position: Sitting, Cuff Size: Normal)   Pulse 71   Temp 98.2 F (36.8 C) (Oral)   Ht 5\' 11"  (1.803 m)   Wt 262 lb (118.8 kg)   SpO2 95%   BMI 36.54 kg/m   BP Readings from Last 3 Encounters:  03/03/23 120/70  09/03/22 112/80  07/14/22 118/82    Wt Readings from Last 3 Encounters:  03/03/23 262 lb (118.8 kg)  09/03/22 261 lb (118.4 kg)  07/14/22 264 lb (119.7 kg)    Physical Exam Constitutional:      General: He is not in acute distress.    Appearance: He is well-developed.      Comments: NAD  Eyes:     Conjunctiva/sclera: Conjunctivae normal.     Pupils: Pupils are equal, round, and reactive to light.  Neck:     Thyroid: No thyromegaly.     Vascular: No JVD.  Cardiovascular:     Rate and Rhythm: Normal rate and regular rhythm.     Heart sounds: Normal heart sounds. No murmur heard.    No friction rub. No gallop.  Pulmonary:     Effort: Pulmonary effort is normal. No respiratory distress.     Breath sounds: Normal breath sounds. No wheezing or rales.  Chest:     Chest wall: No tenderness.  Abdominal:     General: Bowel sounds are normal. There is no distension.     Palpations: Abdomen is soft. There is no mass.     Tenderness: There is no abdominal tenderness. There is no guarding or rebound.  Musculoskeletal:        General: No tenderness. Normal range of motion.     Cervical back: Normal range of motion.  Lymphadenopathy:     Cervical: No cervical adenopathy.  Skin:    General: Skin is warm and dry.     Findings: No rash.  Neurological:     Mental Status: He is alert and oriented to person, place, and time.     Cranial Nerves: No cranial nerve deficit.     Motor: No abnormal muscle tone.  Coordination: Coordination normal.     Gait: Gait normal.     Deep Tendon Reflexes: Reflexes are normal and symmetric.  Psychiatric:        Behavior: Behavior normal.        Thought Content: Thought content normal.        Judgment: Judgment normal.    Testes - self exam   Lab Results  Component Value Date   WBC 7.1 10/16/2020   HGB 15.1 10/16/2020   HCT 44.6 10/16/2020   PLT 254.0 10/16/2020   GLUCOSE 91 10/16/2020   CHOL 177 10/16/2020   TRIG 105.0 10/16/2020   HDL 41.60 10/16/2020   LDLCALC 115 (H) 10/16/2020   ALT 14 10/16/2020   AST 17 10/16/2020   NA 139 10/16/2020   K 4.0 10/16/2020   CL 105 10/16/2020   CREATININE 1.07 10/16/2020   BUN 14 10/16/2020   CO2 26 10/16/2020   TSH 2.22 10/16/2020    DG Lumbar Spine Complete  Result  Date: 10/27/2013 CLINICAL DATA:  Scoliosis, back pain EXAM: LUMBAR SPINE - COMPLETE 4+ VIEW COMPARISON:  None. FINDINGS: Five views of lumbar spine submitted. No acute fracture or subluxation. Mild anterior spurring upper and lower endplate of L1 vertebral body. There is mild disc space flattening with anterior spurring at L1-L2 level. Mild disc space flattening at T12-L1 level. Mild disc space flattening at L5-S1 level. There is mild compression deformity upper endplate of T12 vertebral body. Moderate compression deformity T10 and T11 vertebral bodies. These are of indeterminate age. Clinical correlation is necessary. IMPRESSION: Degenerative changes as described above. There is mild compression deformity upper endplate of T12 vertebral body. Moderate compression deformity T10 and T11 vertebral bodies. These are of indeterminate age. Clinical correlation is necessary. Electronically Signed   By: Natasha Mead M.D.   On: 10/27/2013 09:25   DG Thoracic Spine W/Swimmers  Result Date: 10/27/2013 CLINICAL DATA:  Back pain EXAM: THORACIC SPINE - 2 VIEW + SWIMMERS COMPARISON:  None. FINDINGS: Frontal, lateral, and swimmer's views were obtained. There is anterior wedging of the the T10, T11, T12, L1, and L2 vertebral bodies with the greatest degree of wedging at T11 and T12. There is increased kyphosis in the lower thoracic region. No spondylolisthesis. There is mild disc space narrowing at multiple levels. IMPRESSION: Multiple lower thoracic and upper lumbar compression fractures, age uncertain. Increase kyphosis in the lower thoracic region, apparently due to these areas of compression. No spondylolisthesis. 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 Electronically Signed   By: Bretta Bang M.D.   On: 10/27/2013 09:10    Assessment & Plan:   Problem List Items Addressed This Visit     Well adult exam - Primary     We discussed age appropriate health related  issues, including available/recomended screening tests and vaccinations. Labs were ordered to be later reviewed . All questions were answered. We discussed one or more of the following - seat belt use, use of sunscreen/sun exposure exercise, fall risk reduction, second hand smoke exposure, firearm use and storage, seat belt use, a need for adhering to healthy diet and exercise. Labs were ordered.  All questions were answered.        Relevant Orders   TSH   Urinalysis   CBC with Differential/Platelet   Lipid panel   PSA   Comprehensive metabolic panel      No orders of the defined types were placed in this encounter.  Follow-up: Return in about 1 year (around 03/02/2024) for Wellness Exam.  Sonda Primes, MD

## 2023-06-22 ENCOUNTER — Ambulatory Visit: Admitting: Internal Medicine

## 2023-06-22 VITALS — BP 120/90 | HR 75 | Temp 98.1°F | Ht 71.0 in | Wt 268.4 lb

## 2023-06-22 DIAGNOSIS — M545 Low back pain, unspecified: Secondary | ICD-10-CM

## 2023-06-22 DIAGNOSIS — M549 Dorsalgia, unspecified: Secondary | ICD-10-CM | POA: Insufficient documentation

## 2023-06-22 LAB — POC URINALSYSI DIPSTICK (AUTOMATED)
Bilirubin, UA: NEGATIVE
Blood, UA: NEGATIVE
Glucose, UA: NEGATIVE
Ketones, UA: NEGATIVE
Leukocytes, UA: NEGATIVE
Nitrite, UA: NEGATIVE
Protein, UA: NEGATIVE
Spec Grav, UA: 1.015 (ref 1.010–1.025)
Urobilinogen, UA: 0.2 U/dL — NL
pH, UA: 6 (ref 5.0–8.0)

## 2023-06-22 MED ORDER — IBUPROFEN 600 MG PO TABS: ORAL_TABLET | ORAL | Status: AC

## 2023-06-22 NOTE — Progress Notes (Signed)
 Subjective:  Patient ID: Ricky [name], male    DOB: 1980/10/08  Age: 43 y.o. MRN: [id number]  CC: Back Pain (Back pain originating 48 hours ago. Was present in lower back but now has radiated around the right flank. Patient notes no injury they are aware of that caused this. Currently treating with advil  which only helps slightly)   HPI Ricky [name] presents for R LBP  Outpatient Medications Prior to Visit  Medication Sig Dispense Refill   cholecalciferol (VITAMIN D ) 1000 units tablet Take 1,000 Units by mouth daily.     Omega-3 Fatty Acids (FISH OIL PO) Take by mouth.     meloxicam  (MOBIC ) 15 MG tablet Take 1 tablet (15 mg total) by mouth daily. (Patient not taking: Reported on 06/22/2023) 90 tablet 0   No facility-administered medications prior to visit.    ROS: Review of Systems  Constitutional:  Negative for appetite change, fatigue and unexpected weight change.  HENT:  Negative for congestion, nosebleeds, sneezing, sore throat and trouble swallowing.   Eyes:  Negative for itching and visual disturbance.  Respiratory:  Negative for cough.   Cardiovascular:  Negative for chest pain, palpitations and leg swelling.  Gastrointestinal:  Negative for abdominal distention, blood in stool, diarrhea and nausea.  Genitourinary:  Negative for frequency and hematuria.  Musculoskeletal:  Negative for back pain, gait problem, joint swelling and neck pain.  Skin:  Negative for rash.  Neurological:  Negative for dizziness, tremors, speech difficulty and weakness.  Psychiatric/Behavioral:  Negative for agitation, dysphoric mood and sleep disturbance. The patient is not nervous/anxious.     Objective:  BP (!) 120/90   Pulse 75   Temp 98.1 F (36.7 C)   Ht 5\' 11"  (1.803 m)   Wt 268 lb 6.4 oz (121.7 kg)   SpO2 98%   BMI 37.43 kg/m   BP Readings from Last 3 Encounters:  06/22/23 (!) 120/90  03/03/23 120/70  09/03/22 112/80    Wt Readings from Last 3 Encounters:  06/22/23 268 lb  6.4 oz (121.7 kg)  03/03/23 262 lb (118.8 kg)  09/03/22 261 lb (118.4 kg)    Physical Exam Constitutional:      General: He is not in acute distress.    Appearance: He is well-developed.     Comments: NAD  Eyes:     Conjunctiva/sclera: Conjunctivae normal.     Pupils: Pupils are equal, round, and reactive to light.  Neck:     Thyroid : No thyromegaly.     Vascular: No JVD.  Cardiovascular:     Rate and Rhythm: Normal rate and regular rhythm.     Heart sounds: Normal heart sounds. No murmur heard.    No friction rub. No gallop.  Pulmonary:     Effort: Pulmonary effort is normal. No respiratory distress.     Breath sounds: Normal breath sounds. No wheezing or rales.  Chest:     Chest wall: No tenderness.  Abdominal:     General: Bowel sounds are normal. There is no distension.     Palpations: Abdomen is soft. There is no mass.     Tenderness: There is no abdominal tenderness. There is no guarding or rebound.  Musculoskeletal:        General: No tenderness. Normal range of motion.     Cervical back: Normal range of motion.  Lymphadenopathy:     Cervical: No cervical adenopathy.  Skin:    General: Skin is warm and dry.     Findings: No  rash.  Neurological:     Mental Status: He is alert and oriented to person, place, and time.     Cranial Nerves: No cranial nerve deficit.     Motor: No abnormal muscle tone.     Coordination: Coordination normal.     Gait: Gait normal.     Deep Tendon Reflexes: Reflexes are normal and symmetric.  Psychiatric:        Behavior: Behavior normal.        Thought Content: Thought content normal.        Judgment: Judgment normal.     Lab Results  Component Value Date   WBC 7.1 03/03/2023   HGB 15.4 03/03/2023   HCT 46.1 03/03/2023   PLT 256.0 03/03/2023   GLUCOSE 104 (H) 03/03/2023   CHOL 209 (H) 03/03/2023   TRIG 154.0 (H) 03/03/2023   HDL 38.00 (L) 03/03/2023   LDLCALC 140 (H) 03/03/2023   ALT 31 03/03/2023   AST 22 03/03/2023    NA 140 03/03/2023   K 4.2 03/03/2023   CL 104 03/03/2023   CREATININE 1.00 03/03/2023   BUN 13 03/03/2023   CO2 27 03/03/2023   TSH 2.04 03/03/2023   PSA 0.54 03/03/2023    DG Lumbar Spine Complete Result Date: 10/27/2013 CLINICAL DATA:  Scoliosis, back pain EXAM: LUMBAR SPINE - COMPLETE 4+ VIEW COMPARISON:  None. FINDINGS: Five views of lumbar spine submitted. No acute fracture or subluxation. Mild anterior spurring upper and lower endplate of L1 vertebral body. There is mild disc space flattening with anterior spurring at L1-L2 level. Mild disc space flattening at T12-L1 level. Mild disc space flattening at L5-S1 level. There is mild compression deformity upper endplate of T12 vertebral body. Moderate compression deformity T10 and T11 vertebral bodies. These are of indeterminate age. Clinical correlation is necessary. IMPRESSION: Degenerative changes as described above. There is mild compression deformity upper endplate of T12 vertebral body. Moderate compression deformity T10 and T11 vertebral bodies. These are of indeterminate age. Clinical correlation is necessary. Electronically Signed   By: Cordella Deter M.D.   On: 10/27/2013 09:25   DG Thoracic Spine W/Swimmers Result Date: 10/27/2013 CLINICAL DATA:  Back pain EXAM: THORACIC SPINE - 2 VIEW + SWIMMERS COMPARISON:  None. FINDINGS: Frontal, lateral, and swimmer's views were obtained. There is anterior wedging of the the T10, T11, T12, L1, and L2 vertebral bodies with the greatest degree of wedging at T11 and T12. There is increased kyphosis in the lower thoracic region. No spondylolisthesis. There is mild disc space narrowing at multiple levels. IMPRESSION: Multiple lower thoracic and upper lumbar compression fractures, age uncertain. Increase kyphosis in the lower thoracic region, apparently due to these areas of compression. No spondylolisthesis. 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 Electronically  Signed   By: Mordecai Applebaum M.D.   On: 10/27/2013 09:10    Assessment & Plan:   Problem List Items Addressed This Visit     Back pain - Primary   UA      Relevant Medications   ibuprofen  (ADVIL ) 600 MG tablet   Other Relevant Orders   POCT Urinalysis Dipstick (Automated) (Completed)      Meds ordered this encounter  Medications   ibuprofen  (ADVIL ) 600 MG tablet    Sig: Take twice a day x 2 weeks, then prn pain      Follow-up: No follow-ups on file.  Anitra Barn, MD

## 2023-06-22 NOTE — Assessment & Plan Note (Addendum)
 Likely musculoskeletal.  Obtain UA LS-spine x-ray if not better Prescribed ibuprofen  600 mg p.o. 3 times daily as needed

## 2023-06-22 NOTE — Patient Instructions (Signed)
 USEFUL THINGS FOR ARTHRITIS and musculoskeletal pains:    A "rice sock heating pad" refers to a homemade heating pad created by filling a sock with uncooked rice, which can be heated in a microwave to provide a warm compress for sore muscles, pain relief, or other applications; essentially, it's a simple way to generate heat using readily available materials.  Key points about rice sock heat: How to make it: Fill a clean sock (preferably a tube sock) about 2/3 full with uncooked rice, tie a knot at the top to secure the rice inside.  Heating it up: Place the rice sock in the microwave and heat in short intervals (usually around 30 seconds at a time) until it reaches the desired warmth.  Important considerations: Check temperature before applying: Always test the temperature of the rice sock before applying it to your skin to avoid burns.  Use a towel to protect skin: Wrap the rice sock in a thin towel to distribute the heat evenly and protect your skin.  Uses: Muscle aches and pains  Menstrual cramps  Neck pain  Arthritis discomfort      BLUE EMU CREAM: Use it 2-3 times a day on painful areas

## 2023-07-26 IMAGING — DX DG HIP (WITH OR WITHOUT PELVIS) 2-3V*L*
3 series · 3 of 3 positions shown · non-contrast
Comparison: None.

CLINICAL DATA: Left hip pain.

EXAM:
DG HIP (WITH OR WITHOUT PELVIS) 2-3V LEFT

[pelvis ap]
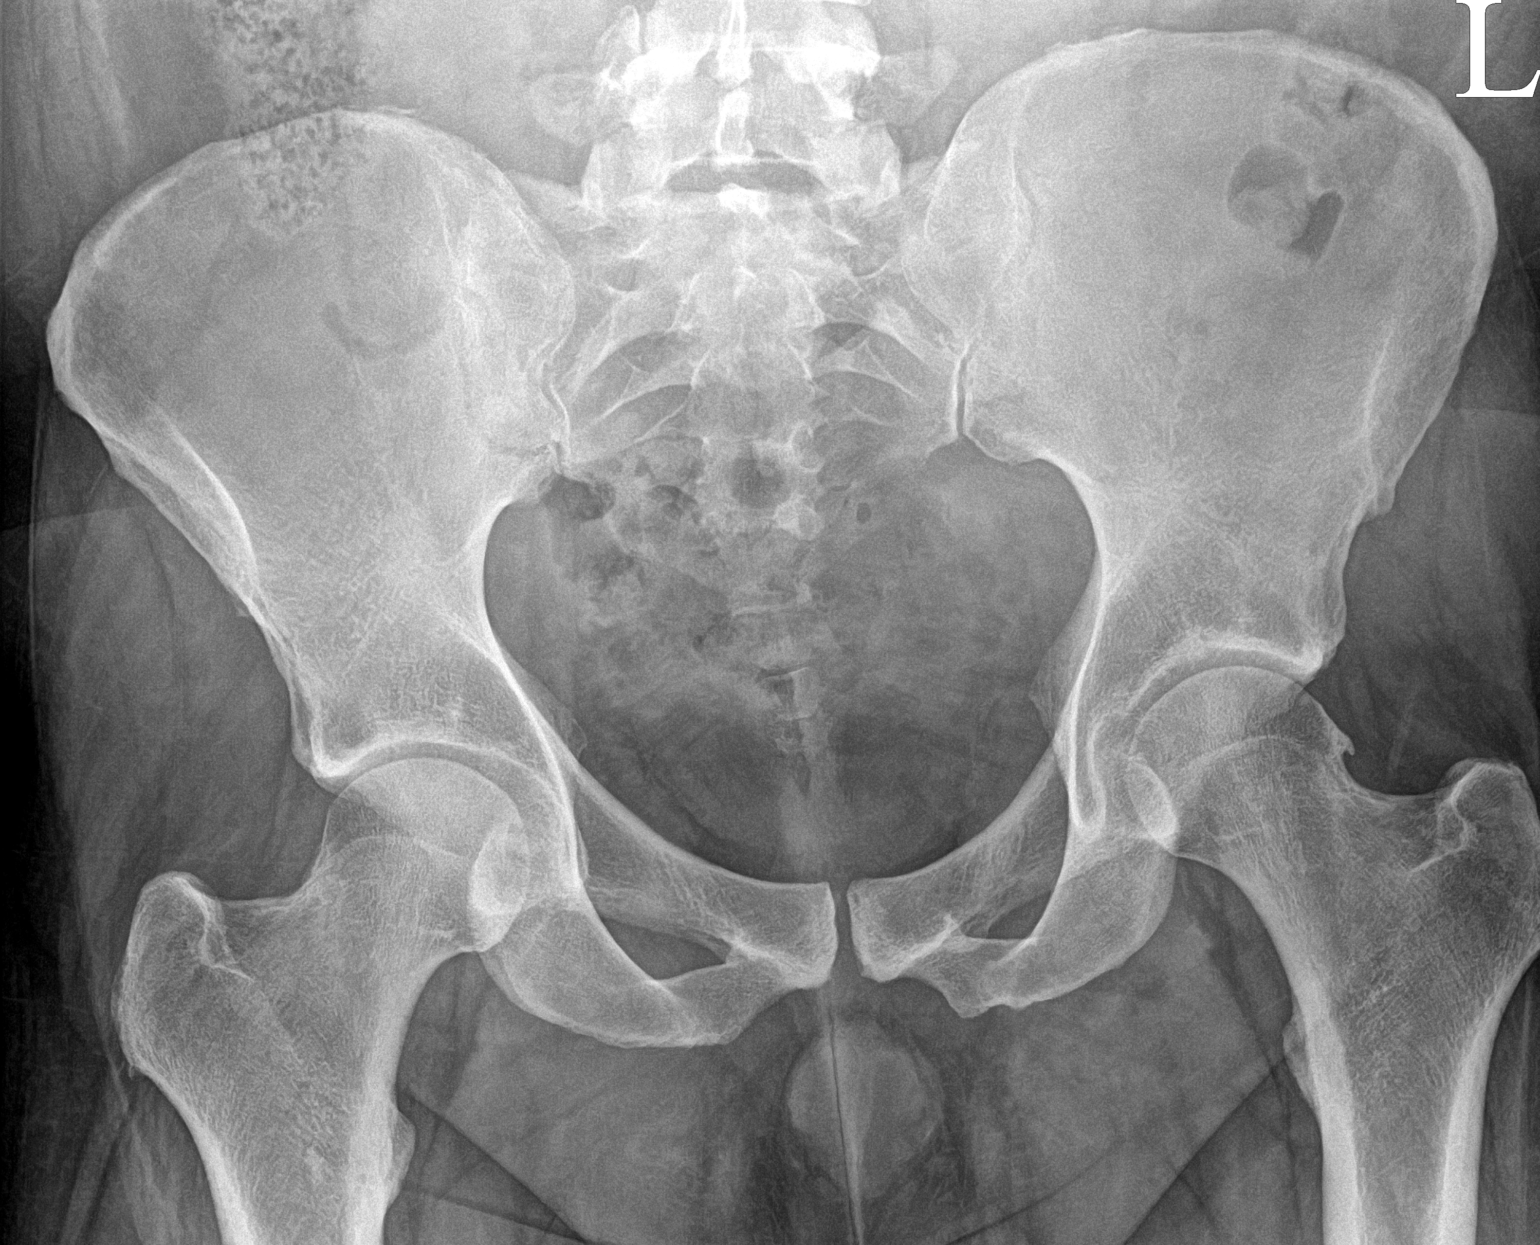

[hip ap]
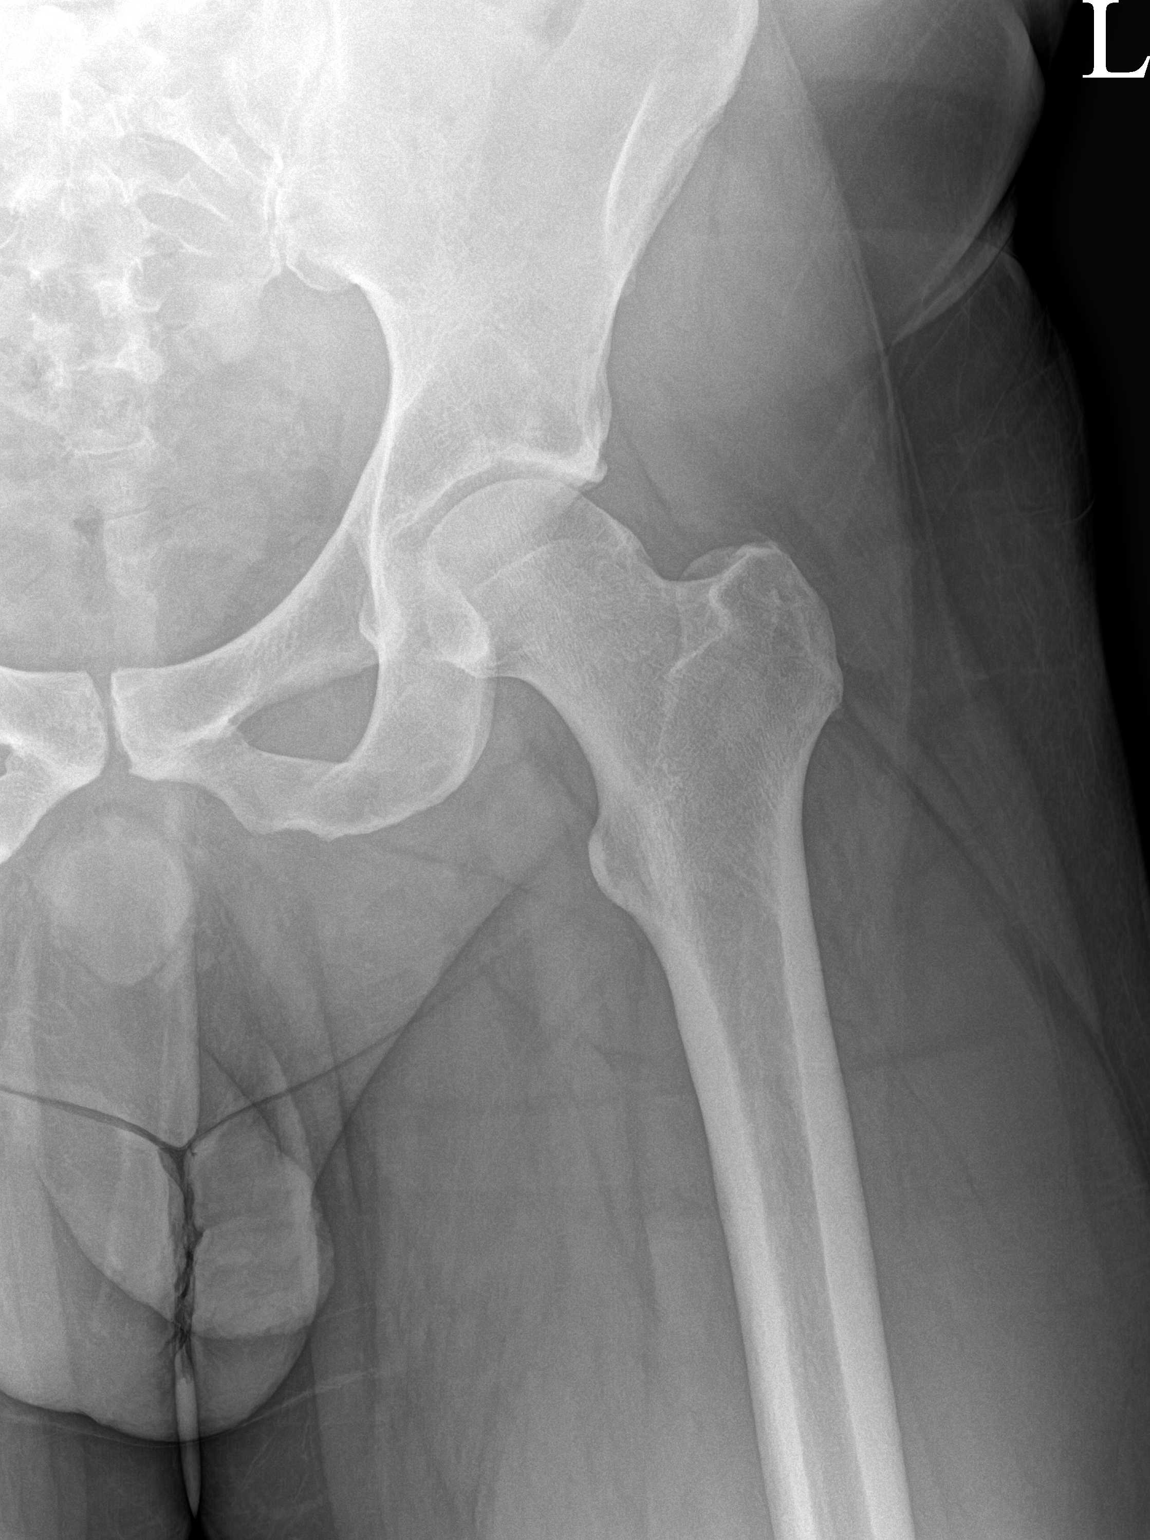

[hip frog leg]
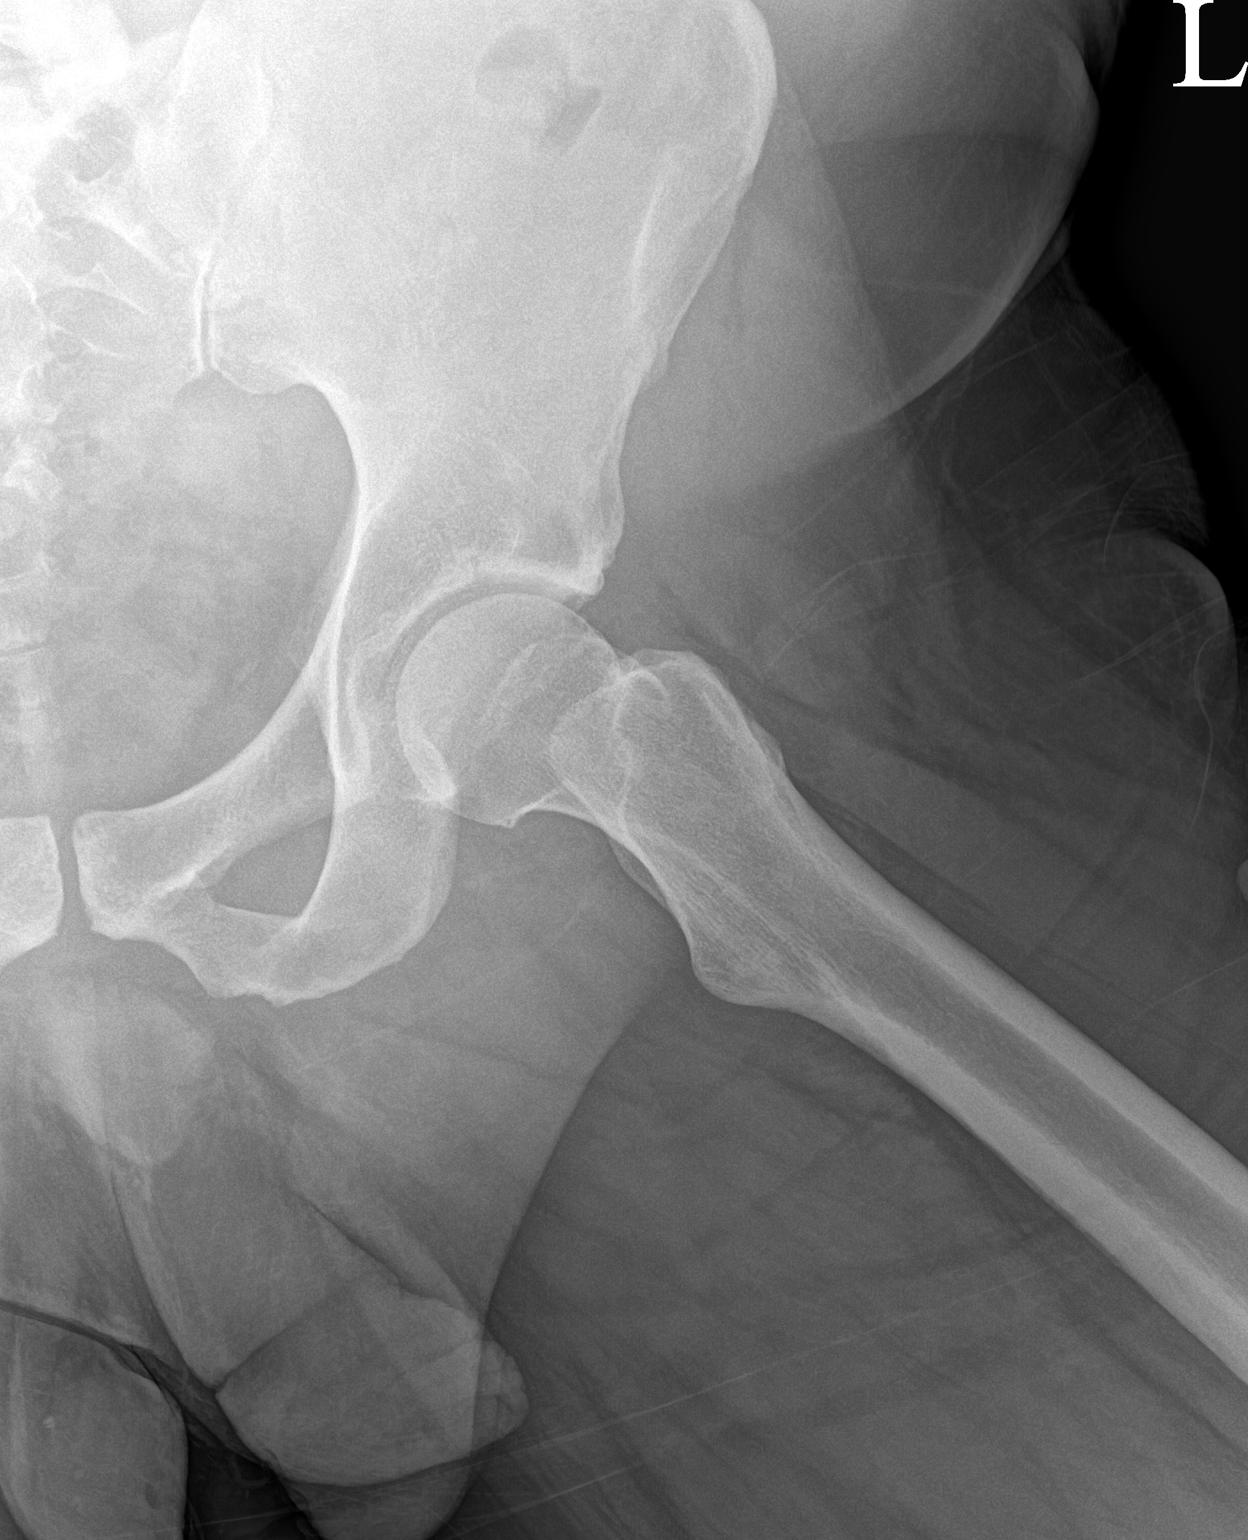

[3 of 3 positions shown; findings below may reference images not displayed]

FINDINGS: No acute fracture or dislocation. The bones are well mineralized.
Mild arthritic changes of the left hip. The soft tissues are
unremarkable.
IMPRESSION: 1. No acute fracture or dislocation.
2. Mild arthritic changes of the left hip.
